# Patient Record
Sex: Female | Born: 1955 | Marital: Married | Smoking: Never smoker
Health system: Northeastern US, Community
[De-identification: ages and names within clinical notes are randomized; demographics above are authoritative.]

## PROBLEM LIST (undated history)

## (undated) VITALS — BP 131/85 | HR 78 | Temp 97.8°F | Resp 16 | Ht 62.0 in | Wt 161.0 lb

## (undated) DIAGNOSIS — H269 Unspecified cataract: Secondary | ICD-10-CM

## (undated) DIAGNOSIS — O24419 Gestational diabetes mellitus in pregnancy, unspecified control: Secondary | ICD-10-CM

## (undated) DIAGNOSIS — IMO0001 Reserved for inherently not codable concepts without codable children: Secondary | ICD-10-CM

## (undated) DIAGNOSIS — N750 Cyst of Bartholin's gland: Secondary | ICD-10-CM

## (undated) HISTORY — DX: Unspecified cataract: H26.9

## (undated) HISTORY — DX: Cyst of Bartholin's gland: N75.0

## (undated) HISTORY — DX: Reserved for inherently not codable concepts without codable children: IMO0001

## (undated) HISTORY — PX: TONSILLECTOMY & ADENOIDECTOMY <AGE 12: ENT168

---

## 2011-04-04 HISTORY — PX: VARICOSE VEIN SURGERY: SHX832

## 2013-01-06 ENCOUNTER — Ambulatory Visit (HOSPITAL_BASED_OUTPATIENT_CLINIC_OR_DEPARTMENT_OTHER): Payer: Medicaid Other | Admitting: Optometrist

## 2013-01-06 DIAGNOSIS — H5200 Hypermetropia, unspecified eye: Secondary | ICD-10-CM

## 2013-01-06 DIAGNOSIS — H269 Unspecified cataract: Secondary | ICD-10-CM

## 2013-01-06 DIAGNOSIS — H04129 Dry eye syndrome of unspecified lacrimal gland: Secondary | ICD-10-CM

## 2013-01-06 DIAGNOSIS — H524 Presbyopia: Secondary | ICD-10-CM

## 2013-01-06 NOTE — Progress Notes (Signed)
I had a pleasure of seeing 57 year old Tammy Russell at the [facility] on 01/06/2013.   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  Glasses should be worn full time.hold on Rx if surgery is considered.        Return to clinic in one months for follow-up by Ophthalmologist.    The patient has dry eye syndrome.   She is instructed to apply warm compresses to her closed eyelids twice a day, for five minutes at a time.  she can Use over-the-counter artificial tears such as Refresh, Systane, Theratears, Soothe, or Visine Tears 4 X per day or as needed.

## 2013-01-06 NOTE — Nursing Note (Signed)
>>   Rockey Situ, OD     Mon Jan 06, 2013  7:22 PM  Blurry vision for near, dry eyes sometime. Need reading glasses.

## 2013-01-15 ENCOUNTER — Ambulatory Visit (HOSPITAL_BASED_OUTPATIENT_CLINIC_OR_DEPARTMENT_OTHER): Payer: Medicaid Other | Admitting: Ophthalmology

## 2013-01-16 ENCOUNTER — Ambulatory Visit (HOSPITAL_BASED_OUTPATIENT_CLINIC_OR_DEPARTMENT_OTHER): Payer: Medicaid Other | Admitting: Ophthalmology

## 2013-01-16 DIAGNOSIS — H5200 Hypermetropia, unspecified eye: Secondary | ICD-10-CM

## 2013-01-16 DIAGNOSIS — H524 Presbyopia: Secondary | ICD-10-CM

## 2013-01-16 DIAGNOSIS — H269 Unspecified cataract: Secondary | ICD-10-CM

## 2013-01-16 NOTE — Progress Notes (Signed)
Impression:  Cataract OS>>OD:  Risks and benefits of cataract surgery discussed and patient elects to proceed with cataract surgery of the left eye in an attempt to improve the vision of that eye.  Presbyopia    Plan:  Schedule cataract surgery OS at patient's request.  Will need A-scan prior to surgery  Consent signed with telephone interpreting.

## 2013-01-16 NOTE — Nursing Note (Signed)
>>   Tammy Russell. Chilton Si, OD     Thu Jan 16, 2013 11:56 AM  Patient here for further evaluation of cataracts OS>OD following appointment with Dr. Sherrilee Gilles.    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

## 2013-02-18 ENCOUNTER — Ambulatory Visit (HOSPITAL_BASED_OUTPATIENT_CLINIC_OR_DEPARTMENT_OTHER): Payer: Medicaid Other

## 2013-02-18 ENCOUNTER — Ambulatory Visit (HOSPITAL_BASED_OUTPATIENT_CLINIC_OR_DEPARTMENT_OTHER): Payer: Medicaid Other | Admitting: Internal Medicine

## 2013-02-18 ENCOUNTER — Encounter (HOSPITAL_BASED_OUTPATIENT_CLINIC_OR_DEPARTMENT_OTHER): Payer: Self-pay | Admitting: Internal Medicine

## 2013-02-18 VITALS — BP 122/78 | HR 74 | Temp 98.4°F | Wt 164.0 lb

## 2013-02-18 DIAGNOSIS — Z23 Encounter for immunization: Secondary | ICD-10-CM

## 2013-02-18 DIAGNOSIS — Z01818 Encounter for other preprocedural examination: Secondary | ICD-10-CM

## 2013-02-18 DIAGNOSIS — Z1321 Encounter for screening for nutritional disorder: Secondary | ICD-10-CM

## 2013-02-18 DIAGNOSIS — N951 Menopausal and female climacteric states: Secondary | ICD-10-CM | POA: Insufficient documentation

## 2013-02-18 DIAGNOSIS — H269 Unspecified cataract: Secondary | ICD-10-CM

## 2013-02-18 DIAGNOSIS — Z0000 Encounter for general adult medical examination without abnormal findings: Secondary | ICD-10-CM

## 2013-02-18 DIAGNOSIS — H2513 Age-related nuclear cataract, bilateral: Secondary | ICD-10-CM

## 2013-02-18 LAB — VITAMIN D,25 HYDROXY: VITAMIN D,25 HYDROXY: 34 ng/mL (ref 30.0–100.0)

## 2013-02-18 LAB — HEMATOCRIT: HEMATOCRIT: 44.4 % (ref 34.1–44.9)

## 2013-02-18 NOTE — Progress Notes (Signed)
Chief Complaint:  Tammy Russell is a 57 year old female who presents for a pre op examination for Cataract surgery OS  Is new pt to clinic   Is from [place], moved to the [place] in 2013  Pt husband is also present at this visit         Patient Active Problem List:     Cataract     Symptomatic menopausal or female climacteric states        Current Outpatient Prescriptions:  OTHER MEDICATION Takes Natifa- Pro ( [place] HRT) Disp:  Rfl:    omeprazole (PRILOSEC) 10 MG capsule 1 capsule 2 (two) times daily. Disp:  Rfl:      No current facility-administered medications for this visit.    Allergies:  Review of Patient's Allergies indicates:  No Known Allergies    Health Maintenance:  Hiv Screening due on 06/10/1969  Tetanus (16 And Over) due on 06/10/1972  Lipid Screening due on 06/11/1974  Hep B High Risk Vaccine Eval (Once) due on 06/11/1974  Health Care Proxy due on 06/11/1974  Pap Smear due on 06/11/1986  Hpv Screening due on 06/11/1986  Physical Exam (Age 90+) due on 06/11/2006  Mammography due on 06/11/2006  Fecal Occult Blood Age 90+ due on 06/11/2006  Flu Vaccine Seasonal due on 12/02/2012    Immunizations:  Immunization History   Administered Date(s) Administered   . Influenza Virus Quadrivalent Vacc 3/> Yrs Im 02/18/2013       Histories:    Past Medical History    Cataract     Reflux     Bartholin gland cyst     Comment: I & D in [place]          Past Surgical History    CESAREAN SECTION, CLASSIC      TONSILLECTOMY & ADENOIDECTOMY       VARICOSE VEIN SURGERY  2013    Comment in [place]        Social History   Marital Status: Married  Spouse Name: N/A    Years of Education: N/A  Number of Children: N/A     Occupational History  None on file     Social History Main Topics   Smoking status: Never Smoker     Smokeless tobacco: Not on file    Alcohol Use: No    Drug Use: No    Sexual Activity: Yes    Partners: Male     Other Topics Concern   None on file     Social History Narrative    Is from  [place]    Moved to [place]  12/2011.    Lives with husband and 101 year old daugther        Family History    Osteoporosis Mother        Review of Systems:                   Skin: negative  Eyes: negative  Ears/Nose/Throat: negative  Respiratory: negative  Cardiovascular: negative  Gastrointestinal: negative  Genitourinary: negative  Gyn: no abnormal bleeding, pelvic pain or discharge  no breast pain or new or enlarging lumps on self exam. Is currently taking HRT from [place]. Reports having annual pelvic ultrasounds and mammograms in [place]  Takes HRT due to hot flashes, dry skin.   Musculoskeletal: negative  Neurologic: negative  Endocrine: negative  Psychiatric: negative  Hematologic/Lymphatic/Immunologic: negative    Physical:  BP 122/78  Pulse 74  Temp(Src) 98.4 F (36.9 C) (Oral)  Wt 164 lb (74.39 kg)  SpO2 98%  General appearance: healthy, alert, well developed, well nourished  Eyes: conjunctivae/corneas clear. PERRL, EOM's intact.+ Cataract OS  Skin: skin color, texture, turgor are normal  Head: Normocephalic. No masses, lesions, tenderness or abnormalities  Ears: External ears normal. Canals clear. TM's normal.  Nose/Sinuses: Nares normal. Septum midline. Mucosa normal. No drainage or sinus tenderness.  Oropharynx: Lips, mucosa, and tongue normal. Teeth and gums normal. Oropharynx moist and without lesion  Neck: Neck supple. No adenopathy. Thyroid symmetric, normal size, and without nodularity  Lungs: Percussion normal. Good diaphragmatic excursion. Lungs clear to auscultation bilaterally  Heart: PMI normal. No lifts, heaves, or thrills. RRR. No murmurs, clicks, gallops or rubs  Abdomen: Abdomen soft, non-tender. BS normal. No masses, no organomegaly  Extremities: Extremities normal. No deformities, edema, or skin discoloration  Musculoskeletal: Spine ROM normal. Muscular strength intact.  Peripheral pulses: radial=4/4, femoral=4/4, popliteal=4/4, dorsalis pedis=4/4  Neuro: Gait normal. Reflexes normal and symmetric. Sensation  grossly normal       ASSESSMENT/PLAN:  (366.9) Cataract  (primary encounter diagnosis) and   (V72.84) Preoperative examination, unspecified  Comment:  Normal pre op exam  EKG: NSR: pt is cleared for surgery   Plan: EKG  Plan: HEMATOCRIT AUTO           (627.2) Symptomatic menopausal or female climacteric states  Comment: Is currently taking HRT from [place] ( Natifa- Pro)  Discussed cardiovascular and cancer risks with use of HRT  Pt desires to continue to take HRT, is followed by her MD in [place] with routine mammograms and pelvic ultrasound  Reports having pelvic ultrasound and mammogram this year which was normal   Plan:     (V77.99) Encounter for vitamin deficiency screening  Comment: at this visit, pt also requests to have Vitamin D level checked since she now lives in [place]   Plan: VITAMIN D,25 HYDROXY, COLLECTION VENOUS BLOOD         VENIPUNCTURE            (V04.81) Need for prophylactic vaccination and inoculation against influenza  Comment:  Plan: IMMUNIZATION ADMIN SINGLE, RN, PR INFLUENZA         VACCINE QUADRIVALENT 3 YRS PLUS IM           I have spent 30 minutes in face to face time with this patient/patient proxy of which > 50% was in counseling or coordination of care regarding above issues/Dx.

## 2013-02-18 NOTE — Patient Instructions (Signed)
[facility]  [address]  [address]  (Formerly Known As [facility] [address])  [place]  [id number]  Clinical Nutrition Services    Calcium and Your Health    Calcium is a mineral that helps build strong bones and teeth, helps prevent brittle bones (osteoporosis) and hip fractures, and helps maintain a normal blood pressure. It is very important to get enough calcium each day.    * CALCIUM NEEDS AT ALL AGES: (RDI 2000)  Infants birth to 12 months  is 210 -270 mg/day          Child 1-8 years  is 500-.800 mg/day                     Adolescent 9-18years is 1300 mg/day                   Adults 19-30 years is 1000 mg/day  Adults 31-50 years is 1000 mg/day  Adults 51 and older is 1200 mg/day    * TO MAINTAIN GOOD BONE HEALTH, EAT A CALCIUM RICH DIET:    FOODS                                      Yogurt, plain, nonfat: 1 cup serving is 450 mgs of calcium    Ricotta cheese, part skim: 1/2 cup serving is 340 mgs of calcium    Milk, skim: 1 cup serving is 300 mgs of calcium    Milk, whole: 1 cup serving is 290 mgs of calcium    Sardines, canned, with bones: 3 oz serving is 280 mgs of calcium    Yogurt, plain, whole milk:1 cup serving is 275 mgs of calcium    Orange juice, calcium fortified:1 cup serving is 270 mgs of calcium    Swiss cheese: 1 oz serving is 270 mgs of calcium    Spinach, cooked: 1 cup serving is 240 mgs of calcium   Turnip greens, rhubarb, cooked:1 cup serving is 200 mgs of calcium   Salmon, canned, with bones: 3 oz serving is 180 mgs of calcium   Ice Cream: 1 cup serving is 175 mgs of calcium   Pudding, instant mix: 1/2 cup serving is 150 mgs of calcium   Almonds:  /2 cup serving is 150 mgs of calcium   Kale, cooked: 1 cup serving is 100 mgs of calcium   Lobster, cooked: 6 oz serving is 100 mgs of calcium   Tofu, with calcium sulfate:  3oz serving is 100 mgs of calcium     EXERCISE: Weight bearing exercises, such as walking, jogging,  racquet sports, aerobics, and weight training  help make bones stronger.     VITAMIN D: Vitamin D is essential to help your body absorb calcium. A healthy body can make its own vitamin D with the help of sunlight. But in the winter months, you may not get enough sun, and should make   sure you get it from another source, like milk or a multivitamin.     HORMONES: The hormone estrogen helps the female body and bones to use calcium. After menopause women need to discuss hormone treatment with their doctor.    * This is just a first step in helping improve your health.  * For help with a meal plan for you, make an appointment with the Nutritionist.  *   For more information, you can also contact the National Nutrition Hotline 1-800-366-1655                                                              9/95 MAS 1/96, 12/97eqj, 01/02, 4/02

## 2013-02-18 NOTE — Progress Notes (Signed)
02/18/2013  Flu Vaccine   Confirmed patient's name and date of birth.  Pt denies allergies to this vaccine.   Pt denies allergies to egg or egg products.  Pt denies allergy to contact lens solution.   Pt denies adverse effects from previous administration of this medication. Pt denies history of Guillain-Barre' syndrome.  Pt denies moderate/severe illness at this time.  Risks and benefits of Flu Vaccine reviewed with pt. VIS for Flu Vaccine  offered and reviewed with pt.    Vaccine tolerated well by patient. Patient denies adverse effects from injection at this time. Patient encouraged to utilize arm and not favor it.  Patient informed may  take pain reliever of choice and to apply ice for discomfort if necessary.  Patient will call with any questions or concerns.  Please refer to Imm./Inj. section for administration site, lot # and exp. date.    Jana Taiwana Willison, LPN

## 2013-02-20 LAB — EKG

## 2013-02-26 ENCOUNTER — Encounter (HOSPITAL_BASED_OUTPATIENT_CLINIC_OR_DEPARTMENT_OTHER): Payer: Self-pay | Admitting: Ophthalmology

## 2013-02-26 ENCOUNTER — Ambulatory Visit (HOSPITAL_BASED_OUTPATIENT_CLINIC_OR_DEPARTMENT_OTHER)
Admit: 2013-02-26 | Discharge: 2013-02-26 | Disposition: A | Discharge status: Home / Self Care | Payer: Self-pay | Source: Ambulatory Visit | Attending: Ophthalmology | Admitting: Ophthalmology

## 2013-02-26 HISTORY — DX: Gestational diabetes mellitus in pregnancy, unspecified control: O24.419

## 2013-02-26 NOTE — H&P (Signed)
PRE-ANESTHETIC HISTORY AND PHYSICAL EXAM    HPI: Tammy Russell is a 57 year old female with a history of GERD who is scheduled for a left ECCE.    ANESTHESIA HISTORY:     Past Surgical History    CESAREAN SECTION, CLASSIC      TONSILLECTOMY & ADENOIDECTOMY       VARICOSE VEIN SURGERY  2013    Comment in [place]    D&C    Patient denied complications of anesthesia without problems except for postop shivering  Patient denied family complications of anesthesia.    CURRENT MEDICATIONS:      omeprazole (PRILOSEC) 10 MG capsule 1 capsule 2 (two) times daily.     ALLERGIES:   Review of Patient's Allergies indicates:  No Known Allergies    SOCIAL HISTORY:  The patient reports that she has never smoked.   The patient reports that she does not drink alcohol.  The patient reports that she does not use illicit drugs.    PROBLEM LIST / PAST MEDICAL HISTORY:  Patient Active Problem List:     Cataract     Symptomatic menopausal or female climacteric states     GERD    REVIEW OF SYMPTOMS:  Functional capacity:  Patient can walk up two flights of stairs without problems  Airway: Denies OSA. Rare snoring.   Cardiovascular:  Pt denies angina, DOE, or  palpitations.   Pulmonary:  Currently breathing is at baseline  GI:  GERD.   On Omeprazole  Neuro: negative  Hematologic:  negative  Other pertinent review of symptoms are negative.    PHYSICAL EXAMINATION:    VITAL SIGNS: BP 125/74  Pulse 78  Temp(Src) 98.6 F (37 C) (Temporal)  Resp 20  Ht 5' 2" (1.575 m)  Wt 161 lb (73.029 kg)  BMI 29.44 kg/m2  SpO2 100%    GENERAL: NAD    AIRWAY EVALUATION / HEENT:  Mallampati: Class I     A soft palate, fauces, uvula, anterior and posterior tonsil pillars are seen..  Airway examined in the sitting position.  Oral Opening:  3 cm  Teeth:  Pt denies any loose teeth    NECK: Full range of Neck Motion:  Yes     RESPIRATORY: clear to auscultation bilaterally    CARDIOVASCULAR: normal and regular rate and rhythm    STUDIES:    EKG: 02/18/13  Normal  sinus rhythm   Normal ECG   No previous ECGs available     PERTINENT LABS:   Component Value Date/Time   HCT 44.4 02/18/2013 12:43 PM    No results found for this basename: hgba1c    ASSESSMENT AND PLAN:  ASA Assessment: I A Normal Healthy Patient  Tammy Russell is a 57 year old female with a history of GERD who is scheduled for a left ECCE. The patient  is asymptomatic with a good functional capacity who appears medically optimized for this procedure.    Emergency:  No     Potential Anesthesia Problems:  No    Plan: MAC / General Anesthesia Backup    Patient instructed to take her Omeprazole the morning of the procedure with a sip of water.     I have informed the patient and cousin, with a [place] interpreter, of the nature and purpose of the type of anesthesia, the reasonable alternative anesthesia methods, pertinent foreseeable risks involved and the possibility of complications.  I have explained that an alternative form of anesthesia may be required   by unexpected conditions arising before or during the procedure.  Questions have been answered to the satisfaction of the patient  who accepts the risk and agrees to proceed as planned.     Niko Penson, 02/26/2013, 10:19 AM    Pager: N/A        Bold Font = Mandatory Field.  For non-mandatory fields, the provider will enter relevant positive findings.

## 2013-02-26 NOTE — Discharge Instructions (Signed)
INSTRUES DO PR-OPERATRIO PARA O SURGICAL DAY CARE   SURGICAL DAY CARE PRE-OPERATIVE INSTRUCTIONS    DIA DA CIRURGIA  DAY OF SURGERY    Chegar em: [facility] em [place] (Tuesday), Dezembro (December) 2 s (Time): 9:00am.   Arrive at:  Registration on (Day of the Week, Month, Day, at (Time).        obrigatrio ter um adulto responsvel disponvel para acompanh-lo at [employer] a [place]. (Sugerimos que voc tenha algum disponvel para ajud-lo em casa aps a sua cirurgia).  You must have a responsible adult available to accompany you home after surgery. (We suggest that you have someone available to assist you at home after your surgery).    INSTRUES:   INSTRUCTIONS:   Voc no poder comer ou beber nada aps a meia-noite da noite anterior  cirurgia, nem mesmo gua, goma de mascar ou balas.   You may have nothing to eat or drink after midnight the night before your surgery, not even water, gum or hard candy.     Voc no poder fumar na manh do dia da sua cirurgia.  You may not smoke the morning of your surgery.     Por favor, deixe em casa todos os objetos de valor, incluindo joias, relgios, dinheiro, etc.  Please leave all valuables at home, including jewelry, watches, money, etc.     Remova qualquer esmalte das unhas antes de chegar ao Surgical Day Care e no use qualquer maquiagem ou batom.  Please remove all fingernail polish before arriving at Surgical Day Care and do not  wear any face or lip make-up.     No raspe os pelos da rea da cirurgia.  Do not shave surgical site.     No use lentes de contato.  Do not wear contact lenses.    MEDICAMENTOS:   MEDICATIONS:     Tome a medicao a seguir na noite anterior  cirurgia, na hora de dormir: Your usual evening medications   Take the following medication the night before surgery at bedtime:     [place] a seguir na manh do dia da sua cirurgia, com apenas um gole de gua: Omeprazole  Take the following  medication the morning of your surgery with only a sip of water:

## 2013-03-04 ENCOUNTER — Encounter (HOSPITAL_BASED_OUTPATIENT_CLINIC_OR_DEPARTMENT_OTHER): Payer: Self-pay | Admitting: Ophthalmology

## 2013-03-04 ENCOUNTER — Ambulatory Visit (HOSPITAL_BASED_OUTPATIENT_CLINIC_OR_DEPARTMENT_OTHER)
Admit: 2013-03-04 | Disposition: A | Discharge status: Home / Self Care | Payer: Self-pay | Source: Ambulatory Visit | Attending: Ophthalmology | Admitting: Ophthalmology

## 2013-03-04 DIAGNOSIS — Z961 Presence of intraocular lens: Secondary | ICD-10-CM | POA: Insufficient documentation

## 2013-03-04 HISTORY — PX: CATARACT REMOVAL INSERTION OF LENS: OPH121

## 2013-03-04 MED ORDER — LIDOCAINE-EPINEPHRINE 2 %-1:200000 IJ SOLN
INTRAMUSCULAR | Status: AC
Start: 2013-03-04 — End: 2013-03-04
  Administered 2013-03-04: 3 mL via INTRAOCULAR
  Filled 2013-03-04: qty 20

## 2013-03-04 MED ORDER — ERYTHROMYCIN 5 MG/GM OP OINT
0.5000 [in_us] | TOPICAL_OINTMENT | Freq: Once | OPHTHALMIC | Status: AC
Start: 2013-03-04 — End: 2013-03-04
  Administered 2013-03-04: 0.5 [in_us] via OPHTHALMIC

## 2013-03-04 MED ORDER — MIDAZOLAM HCL 2 MG/2ML IJ SOLN
INTRAMUSCULAR | Status: AC
Start: 2013-03-04 — End: 2013-03-04
  Filled 2013-03-04: qty 0.5

## 2013-03-04 MED ORDER — GENTAMICIN SULFATE 40 MG/ML IJ SOLN
INTRAMUSCULAR | Status: AC
Start: 2013-03-04 — End: 2013-03-04
  Administered 2013-03-04: 20 mg via INTRAOCULAR
  Filled 2013-03-04: qty 2

## 2013-03-04 MED ORDER — PHENYLEPHRINE HCL 2.5 % OP SOLN
OPHTHALMIC | Status: DC
Start: 2013-03-04 — End: 2013-03-04
  Filled 2013-03-04: qty 5

## 2013-03-04 MED ORDER — BSS IO SOLN
INTRAOCULAR | Status: AC
Start: 2013-03-04 — End: 2013-03-04
  Administered 2013-03-04: 30 mL
  Filled 2013-03-04: qty 30

## 2013-03-04 MED ORDER — LACTATED RINGERS IV SOLN
INTRAVENOUS | Status: DC
Start: 2013-03-04 — End: 2013-03-04
  Administered 2013-03-04: 10:00:00 via INTRAVENOUS

## 2013-03-04 MED ORDER — ACETAMINOPHEN 325 MG PO TABS
650.0000 mg | ORAL_TABLET | Freq: Four times a day (QID) | ORAL | Status: DC | PRN
Start: 2013-03-04 — End: 2013-03-04

## 2013-03-04 MED ORDER — CYCLOPENTOLATE HCL 1 % OP SOLN
1.0000 [drp] | OPHTHALMIC | Status: AC
Start: 2013-03-04 — End: 2013-03-04
  Administered 2013-03-04 (×3): 1 [drp] via OPHTHALMIC

## 2013-03-04 MED ORDER — TROPICAMIDE 1 % OP SOLN
1.0000 [drp] | OPHTHALMIC | Status: AC
Start: 2013-03-04 — End: 2013-03-04
  Administered 2013-03-04 (×3): 1 [drp] via OPHTHALMIC

## 2013-03-04 MED ORDER — TETRACAINE HCL 0.5 % OP SOLN
OPHTHALMIC | Status: DC
Start: 2013-03-04 — End: 2013-03-04
  Filled 2013-03-04: qty 2

## 2013-03-04 MED ORDER — BUPIVACAINE HCL (PF) 0.75 % IJ SOLN
INTRAMUSCULAR | Status: AC
Start: 2013-03-04 — End: 2013-03-04
  Administered 2013-03-04: 3 mL via INTRAOCULAR
  Filled 2013-03-04: qty 10

## 2013-03-04 MED ORDER — ACETAZOLAMIDE ER 500 MG PO CP12
500.0000 mg | ORAL_CAPSULE | Freq: Once | ORAL | Status: AC
Start: 2013-03-04 — End: 2013-03-04
  Administered 2013-03-04: 500 mg via ORAL

## 2013-03-04 MED ORDER — PROPOFOL 10 MG/ML IV EMUL
INTRAVENOUS | Status: AC
Start: 2013-03-04 — End: 2013-03-04
  Filled 2013-03-04: qty 20

## 2013-03-04 MED ORDER — ACETYLCHOLINE CHLORIDE 1:100 IO SOLR
INTRAOCULAR | Status: AC
Start: 2013-03-04 — End: 2013-03-04
  Administered 2013-03-04: 2 mL via INTRAOCULAR
  Filled 2013-03-04: qty 2

## 2013-03-04 MED ORDER — FLURBIPROFEN SODIUM 0.03 % OP SOLN
1.0000 [drp] | OPHTHALMIC | Status: AC
Start: 2013-03-04 — End: 2013-03-04
  Administered 2013-03-04 (×3): 1 [drp] via OPHTHALMIC

## 2013-03-04 MED ORDER — MOXIFLOXACIN HCL 0.5 % OP SOLN
1.0000 [drp] | OPHTHALMIC | Status: AC
Start: 2013-03-04 — End: 2013-03-04
  Administered 2013-03-04 (×3): 1 [drp] via OPHTHALMIC

## 2013-03-04 MED ORDER — BSS IO SOLN
Freq: Once | INTRAOCULAR | Status: AC
Start: 2013-03-04 — End: 2013-03-04
  Administered 2013-03-04: 11:00:00 via OPHTHALMIC
  Filled 2013-03-04: qty 0.5

## 2013-03-04 MED ORDER — PHENYLEPHRINE HCL 2.5 % OP SOLN
1.0000 [drp] | OPHTHALMIC | Status: AC
Start: 2013-03-04 — End: 2013-03-04
  Administered 2013-03-04 (×3): 1 [drp] via OPHTHALMIC

## 2013-03-04 NOTE — H&P (Signed)
See H&P by Carolyn [place], APRN on 02/18/13  Patient Assessment Update: (Fill out Prior to procedure or within 24 hours of  admission if H&P done pre-admission.)   Re-evaluation including history review and physical examination has been performed.    Patient's Condition No Change    Tammy Russell, 03/04/2013, 10:27 AM

## 2013-03-04 NOTE — Brief Op Note (Signed)
Brief Procedure or Operative Note    Procedure: Phacoemulsification cataract extraction with PC IOL OS    Pre-Procedure Diagnosis:   Cataract OS    Post-Procedure Diagnosis:  Same    Surgeon: Lanell Persons, MD    Assistant:  None    Type of Anesthesia:   MAC = Monitored Anesthesia Care (Sedation)    Findings:   no abnormalities    Estimated Blood Loss:   negligible    Specimens Removed:  None    Complications:  None    Other (e.g. Implants):  See Ileene Patrick Robbie Rideaux, 03/04/2013, 11:15 AM      Pager : [phone]                  .

## 2013-03-04 NOTE — Op Note (Signed)
DATE OF PROCEDURE:  03/04/13    PROCEDURE:  Phacoemulsification cataract extraction with posterior chamber intraocular lens implantation, left eye    PRE-OP DIAGNOSIS:  Cataract, left eye    POST-OP DIAGNOSIS:  The same    SURGEON:  Marylou Mccoy, MD    ASSISTANT:  None    ANESTHESIA:  Local infiltration with IV sedation.    The patient was prepped and draped in the usual manner for ocular surgery of the left eye.  Anesthesia and akinesia was achieved using a mixture of 2% Xylocaine with epinephrine and 0.5% Marcaine in a 50/50 mixture.  This was administered in a peribulbar block.  Steri-Strips were placed over the lashes of the left eye and a lid speculum was placed in the left eye.  A limbal peritomy was performed superiorly.  Bleeding was controlled with wet-field cautery.  A groove was made at the surgical limbus using a 57 Beaver blade.  The wound was extended anteriorly into clear cornea using a spoon blade.  The anterior chamber was entered at 2 o'clock using a Super blade.  Viscoelastic was injected into the anterior chamber.  The anterior chamber was then entered through the tunneled incision using a 3.0-mm keratome.  An anterior capsulotomy was performed using a continuous curvilinear approach.  The anterior capsular flap was removed from the eye.  BSS was used to hydrodissect and hydrodelineate the lens nucleus.  The lens nucleus was then emulsified and aspirated using the phacoemulsification unit in a divide-and-conquer technique.  The remainder of the cortical material was aspirated using the irrigating aspirating apparatus.  Viscoelastic was then again injected to inflate the posterior capsular bag.  Under viscoelastic a posterior chamber intraocular lens, Bausch and Lomb model LI61AO of +18.5 diopters was injected into the posterior capsular bag.  The remainder of the viscoelastic was aspirated from the eye.  Miochol was injected into the eye.  The wound was then closed using a single 10-0 nylon  interrupted suture.  The conjunctiva was brought down over the wound.  A 0.5 mL of gentamicin and 0.5 mL of Decadron were injected subseptally.  A drop of Vigamox and a drop of erythromycin eye ointment were placed in the left eye and a patch and Fox shield were applied.  The patient left the operating room in excellent condition for the recovery room.

## 2013-03-04 NOTE — H&P (View-Only) (Signed)
PRE-ANESTHETIC HISTORY AND PHYSICAL EXAM    HPI: Tammy Russell is a 57 year old female with a history of GERD who is scheduled for a left ECCE.    ANESTHESIA HISTORY:     Past Surgical History    CESAREAN SECTION, CLASSIC      TONSILLECTOMY & ADENOIDECTOMY       VARICOSE VEIN SURGERY  2013    Comment in [place]    D&C    Patient denied complications of anesthesia without problems except for postop shivering  Patient denied family complications of anesthesia.    CURRENT MEDICATIONS:      omeprazole (PRILOSEC) 10 MG capsule 1 capsule 2 (two) times daily.     ALLERGIES:   Review of Patient's Allergies indicates:  No Known Allergies    SOCIAL HISTORY:  The patient reports that she has never smoked.   The patient reports that she does not drink alcohol.  The patient reports that she does not use illicit drugs.    PROBLEM LIST / PAST MEDICAL HISTORY:  Patient Active Problem List:     Cataract     Symptomatic menopausal or female climacteric states     GERD    REVIEW OF SYMPTOMS:  Functional capacity:  Patient can walk up two flights of stairs without problems  Airway: Denies OSA. Rare snoring.   Cardiovascular:  Pt denies angina, DOE, or  palpitations.   Pulmonary:  Currently breathing is at baseline  GI:  GERD.   On Omeprazole  Neuro: negative  Hematologic:  negative  Other pertinent review of symptoms are negative.    PHYSICAL EXAMINATION:    VITAL SIGNS: BP 125/74  Pulse 78  Temp(Src) 98.6 F (37 C) (Temporal)  Resp 20  Ht 5\' 2"  (1.575 m)  Wt 161 lb (73.029 kg)  BMI 29.44 kg/m2  SpO2 100%    GENERAL: NAD    AIRWAY EVALUATION / HEENT:  Mallampati: Class I     A soft palate, fauces, uvula, anterior and posterior tonsil pillars are seen..  Airway examined in the sitting position.  Oral Opening:  3 cm  Teeth:  Pt denies any loose teeth    NECK: Full range of Neck Motion:  Yes     RESPIRATORY: clear to auscultation bilaterally    CARDIOVASCULAR: normal and regular rate and rhythm    STUDIES:    EKG: 02/18/13  Normal  sinus rhythm   Normal ECG   No previous ECGs available     PERTINENT LABS:   Component Value Date/Time   HCT 44.4 02/18/2013 12:43 PM    No results found for this basename: hgba1c    ASSESSMENT AND PLAN:  ASA Assessment: I A Normal Healthy Patient  Tammy Russell is a 57 year old female with a history of GERD who is scheduled for a left ECCE. The patient  is asymptomatic with a good functional capacity who appears medically optimized for this procedure.    Emergency:  No     Potential Anesthesia Problems:  No    Plan: MAC / General Anesthesia Backup    Patient instructed to take her Omeprazole the morning of the procedure with a sip of water.     I have informed the patient and cousin, with a [place] interpreter, of the nature and purpose of the type of anesthesia, the reasonable alternative anesthesia methods, pertinent foreseeable risks involved and the possibility of complications.  I have explained that an alternative form of anesthesia may be required  by unexpected conditions arising before or during the procedure.  Questions have been answered to the satisfaction of the patient  who accepts the risk and agrees to proceed as planned.     Tammy Russell, 02/26/2013, 10:19 AM    Pager: N/A        Monica Martinez = Mandatory Field.  For non-mandatory fields, the provider will enter relevant positive findings.

## 2013-03-04 NOTE — Discharge Instructions (Signed)
DO NOT operate an automobile or any other heavy machinery for at least 12 hours.  Avoid making important and binding decisions for 24 hours after the surgery.  Dietery Instructions:  You may eat normally, but consider starting with liquids and easily digestible foods, and then progressing to your regular diet as tolerated by your stomach.    Medications:  1.  Unless directed differently by Dr. Chesley Mires, DO NOT start your eye drops until after your first post-operative appointment.  You will be given a bag with drops.  2.  For mild eye discomfort, you may take Tylenol or Non-aspirin pain reliever as needed, every 4 hours for pain.  IF YOU HAVE MORE SEVERE PAIN, PLEASE CALL IMMEDIATELY TO [phone].  Dr. Chesley Mires or Dr. Nathaneil Canary will call you back for instructions.  3.  Continue to take all of your other regular medications that you are taking by mouth.  Also continue taking any eye drops which were previously prescribed for the other non-operative eye.    First post-operative appointment will be the next day at [facility] [address] in [place], [place] [address] at 11AM.  Your eye dressing will be removed and your eye will be examined.  Your post-operative care, including medications, will be reviewed at that time.    Please be sure to keep your follow-up appointment with Dr. Chesley Mires tomorrow.  Cuidados aps uma cirurgia de catarata   (Cataract Surgery, Care After)  Consulte esta planilha nas prximas semanas. Essas instrues fornecem informaes gerais em seus cuidados aps o procedimento. Seu mdico tambm pode lhe fornecer instrues detalhadas. Seu tratamento foi planejado de acordo com as prticas mdicas atuais, mas s vezes ocorrem problemas. Consulte seu mdico se tiver quaisquer problemas ou perguntas aps seu procedimento.   INSTRUES PARA TRATAMENTO DOMICILIAR    Evite atividades extenuantes, conforme receitado pelo seu mdico.   Pergunte ao seu mdico quando poder voltar a dirigir.   Use  colrios ou outros medicamentos para ajudar a Administrator a presso em seu olho conforme orientado por seu mdico.   Somente use remdios de venda livre ou com receita para dor, desconforto ou febre conforme instrudo pelo seu mdico.   No toque ou esfregue seus olhos.   Voc pode ser orientado a usar um tampo de proteo durante os primeiros dias e noites aps a cirurgia. Se no, use culos de sol para proteger seus olhos. Eles servem para proteger seus olhos da presso ou de ser [employer].   Mantenha a rea em torno [employer] limpa e seca. Evite nadar ou deixar que a gua atinja diretamente seus rosto ao tomar banho. Mantenha o sabonete e shampoo [employer].   No incline ou levante objetos pesados. A inclinao aumenta a presso nos olhos. Voc pode caminhar, subir escadas e realizar afazeres domsticos leves.   No coloque lentes de contato no olho que passou por cirurgia at [employer] autorize.   Pergunte ao oftalmologista quando poder retornar ao trabalho. Isto depender do tipo de trabalho que voc faz. Se trabalhar em um ambiente empoeirado, voc pode ser orientado a usar culos de proteo por um perodo de tempo.   Pergunte ao seu oftalmologista quando ser seguro ter relaes sexuais.   Continue com seus exames de vista regulares conforme orientado por seu oftalmologista.  O que esperar:     normal sentir coceira e um leve desconforto por alguns dias aps a cirurgia de catarata. Alguma descarga de fludos tambm  comum e  seu olho pode ficar sensvel  luz e ao toque.   Aps 1 ou 2 dias, mesmo um desconforto moderado deve desaparecer. Na [facility], a cicatrizao ocorrer em cerca de 6 semanas.   Se recebeu lentes intraoculares (IOL), voc pode notar que as cores esto muito brilhantes ou ter um tom azul. Tambm, se estiver na luz do sol, tudo pode parecer avermelhado por algumas horas. Se enxergar esses tons de cores  porque seu  cristalino est mais claro e no mais turvo. Dentro de alguns meses aps receber uma IOL, essas cores extras devem desaparecer. Quando tiver cicatrizado, voc provavelmente precisar de novos culos.  PROCURE UM MDICO SE:    Tiver um aumento do machucado em torno do olho.   Tiver desconforto e o medicamento no ajudar.  PROCURE UM MDICO IMEDIATAMENTE SE:    Tiver febre.   Tiver uma piora ou uma sbita perda da viso.   Houver vermelhido, inchao ou crescente dor no olho.   Tiver uma descarga grosa do olho que passou pela cirurgia.  CERTIFIQUE-SE DE:    Compreender estas instrues.   Observar as suas condies.   Procurar um mdico imediatamente se no se sentir bem ou piorar.  Document Released: 01/15/2009 Document Revised: 06/12/2011  [facility] Patient Information 2014 [place], [place].

## 2013-03-04 NOTE — Interval H&P Note (Signed)
ANESTHESIA INTERVAL NOTE   History and chart reviewed, patient examined prior to administration of anesthesia. Feels well. No change in clinical condition. NPO. Appears stable for CAT today. VSS.  There were no vitals taken for this visit.   Plan -MAC  Nadara Mustard, MD, 03/04/2013, 9:51 AM  Pager [phone]

## 2013-03-05 ENCOUNTER — Ambulatory Visit (HOSPITAL_BASED_OUTPATIENT_CLINIC_OR_DEPARTMENT_OTHER): Payer: Medicaid Other | Admitting: Ophthalmology

## 2013-03-05 DIAGNOSIS — H269 Unspecified cataract: Secondary | ICD-10-CM

## 2013-03-05 DIAGNOSIS — Z961 Presence of intraocular lens: Secondary | ICD-10-CM

## 2013-03-05 MED FILL — Prednisolone Acetate Ophth Susp 1%: OPHTHALMIC | Qty: 5 | Status: AC

## 2013-03-05 MED FILL — Moxifloxacin HCl Ophth Soln 0.5% (Base Equiv): OPHTHALMIC | Qty: 3 | Status: AC

## 2013-03-05 MED FILL — Flurbiprofen Sodium Ophth Soln 0.03%: OPHTHALMIC | Qty: 2.5 | Status: AC

## 2013-03-05 MED FILL — Cyclopentolate HCl Ophth Soln 1%: OPHTHALMIC | Qty: 2 | Status: AC

## 2013-03-05 MED FILL — Erythromycin Ophth Oint 5 MG/GM: OPHTHALMIC | Qty: 3.5 | Status: AC

## 2013-03-05 MED FILL — Acetazolamide Cap ER 12HR 500 MG: ORAL | Qty: 1 | Status: AC

## 2013-03-05 NOTE — Nursing Note (Signed)
>>   Tammy Russell,OT     Wed Mar 05, 2013 10:34 AM  1 day S/P phaco w/ PC IOL - OS 03/04/2013 by Dr. Chesley Mires    No pain, No discomfort

## 2013-03-06 MED ORDER — PREDNISOLONE ACETATE 1 % OP SUSP
1.0000 [drp] | Freq: Four times a day (QID) | OPHTHALMIC | Status: DC
Start: 2013-03-06 — End: 2013-03-12

## 2013-03-06 MED ORDER — MOXIFLOXACIN HCL 0.5 % OP SOLN
1.00 [drp] | Freq: Four times a day (QID) | OPHTHALMIC | Status: AC
Start: 2013-03-06 — End: 2013-03-13

## 2013-03-06 NOTE — Progress Notes (Signed)
Impression:  Pseudophakia OS:  Doing well 1 day s/p cataract extraction.  Cataract OD    Plan:  Begin Pred forte and Vigamox OS qid  Shield at bedtime X 1 week  Call with increased pain or decreased vision  See 1 week.

## 2013-03-12 ENCOUNTER — Ambulatory Visit (HOSPITAL_BASED_OUTPATIENT_CLINIC_OR_DEPARTMENT_OTHER): Payer: Medicaid Other | Admitting: Ophthalmology

## 2013-03-12 DIAGNOSIS — H26492 Other secondary cataract, left eye: Secondary | ICD-10-CM

## 2013-03-12 DIAGNOSIS — Z961 Presence of intraocular lens: Secondary | ICD-10-CM

## 2013-03-12 MED ORDER — PREDNISOLONE ACETATE 1 % OP SUSP
1.0000 [drp] | Freq: Four times a day (QID) | OPHTHALMIC | Status: DC
Start: 2013-03-12 — End: 2013-03-12

## 2013-03-12 MED ORDER — PREDNISOLONE ACETATE 1 % OP SUSP
1.0000 [drp] | Freq: Two times a day (BID) | OPHTHALMIC | Status: DC
Start: 2013-03-12 — End: 2013-04-07

## 2013-03-12 NOTE — Progress Notes (Signed)
Impression:  Pseudophakia OS:  Doing well 1 week s/p cataract extraction.  Mild posterior capsular opacification OS  Cataract OD    Plan:  Discontinue Vigamox  Continue Pred forte OS tid X 1 week, then bid  See 4 weeks.  Refract  Consider YAG capsulotomy

## 2013-03-12 NOTE — Nursing Note (Signed)
>>   Dina Luis-Travassos,OT     Wed Mar 12, 2013 10:00 AM  1 week S/P phaco w/ PC IOL - OS 03/04/2013 by Dr. Chesley Mires    VA is much improved in OS after surgery, but still blurry SC.    H/O: Pseudophakia          Cataract         >> Dina Luis-Travassos,OT     Wed Mar 12, 2013  9:55 AM    VA is os much improved

## 2013-04-07 ENCOUNTER — Ambulatory Visit (HOSPITAL_BASED_OUTPATIENT_CLINIC_OR_DEPARTMENT_OTHER): Payer: Medicaid Other | Admitting: Ophthalmology

## 2013-04-07 DIAGNOSIS — H26492 Other secondary cataract, left eye: Secondary | ICD-10-CM

## 2013-04-07 DIAGNOSIS — Z961 Presence of intraocular lens: Secondary | ICD-10-CM

## 2013-04-07 MED ORDER — PREDNISOLONE ACETATE 1 % OP SUSP
1.0000 [drp] | Freq: Two times a day (BID) | OPHTHALMIC | Status: DC
Start: 2013-04-07 — End: 2013-04-28

## 2013-04-07 NOTE — Nursing Note (Signed)
>>   Algernon HuxleyEmily R. Bellows,OT     Mon Apr 07, 2013 10:57 AM  [id number] YF here after s/p pciol OS on 03/05/13  Pt states that her VA has been better since surgery still a bit hard for distance.   Denies pain or pressure.

## 2013-04-07 NOTE — Progress Notes (Signed)
Impression:  Pseudophakia OS:  Doing well 1 month  s/p cataract extraction OS.  Mild posterior capsular opacification OS  Cataract OD    Plan:  Discontinue Pred forte OS   Return for YAG capsulotomy

## 2013-04-28 ENCOUNTER — Ambulatory Visit (HOSPITAL_BASED_OUTPATIENT_CLINIC_OR_DEPARTMENT_OTHER): Payer: MEDICAID | Admitting: Ophthalmology

## 2013-04-28 ENCOUNTER — Encounter (HOSPITAL_BASED_OUTPATIENT_CLINIC_OR_DEPARTMENT_OTHER): Payer: Self-pay | Admitting: Ophthalmology

## 2013-04-28 DIAGNOSIS — H26492 Other secondary cataract, left eye: Secondary | ICD-10-CM

## 2013-04-28 DIAGNOSIS — Z961 Presence of intraocular lens: Secondary | ICD-10-CM

## 2013-04-28 HISTORY — PX: POST-CATARACT LASER SURGERY: OPH84

## 2013-04-28 MED ORDER — PREDNISOLONE ACETATE 1 % OP SUSP
1.0000 [drp] | Freq: Two times a day (BID) | OPHTHALMIC | Status: DC
Start: 2013-04-28 — End: 2013-05-07

## 2013-04-28 NOTE — Nursing Note (Signed)
>>   Tammy Russell,OT     Mon Apr 28, 2013  2:04 PM  The patient is having a Neodymium Yag laser capsulotomy of the left eye.  The risks, benefits and alternatives of Yag laser capsulotomy have been discussed, and the patient has had his/her questions answered satisfactorily.    Preoperative eye drops were installed by (initials) DLT at (time) 1:55 PM.  These are dilating drops (Phe 2.5%; mydriacyl 1%) and alphagan P 0.15%) to the left eye.    If the technician recorded the intraocular pressure in left eye, 20 minutes after the procedure the record IOP (mm HG):

## 2013-04-28 NOTE — Progress Notes (Signed)
The patient is having a Neodymium Yag laser capsulotomy of the left eye.  The risks, benefits and alternatives of Yag laser capsulotomy have been discussed, and the patient has had his/her questions answered satisfactorily.    PATIENT/PROCEDURE VERIFICATION DOCUMENTATION    Correct patient: Yes  Correct procedure: Yes  Correct side, site, mark visible if applicable: Yes  Correct position: Yes  Special equipment/implant(s) present, if applicable: Yes    Time-out completed, documented by provider doing procedure or designated team member:  Marylou MccoyMadeline Dawsen Krieger    04/28/2013    7:52 PM    Neodymium Yag Laser Capsulotomy Record    Power 1.7 millijoules    Number of shots 14    Drawing created? Yes    The patient tolerated well? Yes.    Impression:  Pseudophakia OS  Posterior capsular opacification OS  Cataract OD    Plan:  Resume Pred forte OS qid X 1 week, then discontinue  See 1 week.  Refract/dilate

## 2013-05-07 ENCOUNTER — Ambulatory Visit (HOSPITAL_BASED_OUTPATIENT_CLINIC_OR_DEPARTMENT_OTHER): Payer: Medicaid Other | Admitting: Ophthalmology

## 2013-05-07 DIAGNOSIS — H524 Presbyopia: Secondary | ICD-10-CM

## 2013-05-07 DIAGNOSIS — H269 Unspecified cataract: Secondary | ICD-10-CM

## 2013-05-07 DIAGNOSIS — Z961 Presence of intraocular lens: Secondary | ICD-10-CM

## 2013-05-07 NOTE — Nursing Note (Signed)
>>   Natasha BenceYelena Shifrin,OT     Wed May 07, 2013 10:52 AM  F/u s/p YAG Capsulotomy OS (04/28/13) due to PCO OS, Pseudophakia OS (03/04/13), Cataract OD.  H/o Gestational diabetes.  Vision OS is good.

## 2013-05-10 NOTE — Progress Notes (Signed)
Impression:  Pseudophakia OS:  Doing well 1 week s/p YAG capsulotomy OS;  2 month  s/p cataract extraction OS.   Early cataract OD    Plan:  Discontinue Pred forte OS   Rx given for spectacle lenses  Reassured of ocular health  See 6 months.

## 2013-08-19 ENCOUNTER — Encounter (HOSPITAL_BASED_OUTPATIENT_CLINIC_OR_DEPARTMENT_OTHER): Payer: Self-pay | Admitting: Ophthalmology

## 2013-09-17 ENCOUNTER — Telehealth (HOSPITAL_BASED_OUTPATIENT_CLINIC_OR_DEPARTMENT_OTHER): Payer: Self-pay | Admitting: Internal Medicine

## 2013-09-17 NOTE — Progress Notes (Signed)
Tel phone call to patient, [facility].

## 2013-11-03 ENCOUNTER — Encounter (HOSPITAL_BASED_OUTPATIENT_CLINIC_OR_DEPARTMENT_OTHER): Payer: Self-pay | Admitting: Ophthalmology

## 2013-11-03 ENCOUNTER — Ambulatory Visit (HOSPITAL_BASED_OUTPATIENT_CLINIC_OR_DEPARTMENT_OTHER): Payer: No Typology Code available for payment source | Admitting: Ophthalmology

## 2013-11-03 DIAGNOSIS — H1013 Acute atopic conjunctivitis, bilateral: Secondary | ICD-10-CM

## 2013-11-03 DIAGNOSIS — H5201 Hypermetropia, right eye: Secondary | ICD-10-CM

## 2013-11-03 DIAGNOSIS — H524 Presbyopia: Secondary | ICD-10-CM

## 2013-11-03 DIAGNOSIS — H5200 Hypermetropia, unspecified eye: Secondary | ICD-10-CM

## 2013-11-03 DIAGNOSIS — H1010 Acute atopic conjunctivitis, unspecified eye: Secondary | ICD-10-CM | POA: Insufficient documentation

## 2013-11-03 DIAGNOSIS — H269 Unspecified cataract: Secondary | ICD-10-CM

## 2013-11-03 DIAGNOSIS — Z961 Presence of intraocular lens: Secondary | ICD-10-CM

## 2013-11-03 MED ORDER — CROMOLYN SODIUM 4 % OP SOLN
1.0000 [drp] | Freq: Four times a day (QID) | OPHTHALMIC | Status: DC
Start: 2013-11-03 — End: 2014-04-22

## 2013-11-03 NOTE — Progress Notes (Signed)
Impression:  Allergic conjunctivitis by history  Pseudophakia OS/Early cataract OD    Plan:  Rx given for spectacle lenses  Reassured of ocular health  Crolom OU qid prn irritation  See 1 year.

## 2013-11-03 NOTE — Nursing Note (Signed)
58 year old female s/p phaco/pciol, left eye  Returns for 47month f/u.    Pt c/o painless blur os>od  At distance and near.    She also is bothered by allergy symptoms.  C/o itchy/watery eyes- she requests a script for an allergy med.

## 2014-04-22 ENCOUNTER — Other Ambulatory Visit (HOSPITAL_BASED_OUTPATIENT_CLINIC_OR_DEPARTMENT_OTHER): Payer: Self-pay | Admitting: Ophthalmology

## 2014-05-14 ENCOUNTER — Telehealth (HOSPITAL_BASED_OUTPATIENT_CLINIC_OR_DEPARTMENT_OTHER): Payer: Self-pay

## 2014-05-14 NOTE — Progress Notes (Signed)
Outreached for Mammo/Pap/PE. Pt stated [facility].

## 2014-06-07 ENCOUNTER — Other Ambulatory Visit (HOSPITAL_BASED_OUTPATIENT_CLINIC_OR_DEPARTMENT_OTHER): Payer: Self-pay | Admitting: Ophthalmology

## 2017-06-06 ENCOUNTER — Ambulatory Visit (HOSPITAL_BASED_OUTPATIENT_CLINIC_OR_DEPARTMENT_OTHER): Payer: 59 | Admitting: Ophthalmology

## 2017-08-29 ENCOUNTER — Ambulatory Visit (HOSPITAL_BASED_OUTPATIENT_CLINIC_OR_DEPARTMENT_OTHER): Payer: 59 | Admitting: Ophthalmology

## 2017-08-29 DIAGNOSIS — H25811 Combined forms of age-related cataract, right eye: Secondary | ICD-10-CM

## 2017-08-29 DIAGNOSIS — H524 Presbyopia: Secondary | ICD-10-CM

## 2017-08-29 DIAGNOSIS — Z961 Presence of intraocular lens: Secondary | ICD-10-CM

## 2017-08-29 NOTE — Progress Notes (Signed)
62 year old female Here for  Cataract Evaluation OD   Patient States:  (+) Vision Changes " Blurred Vision at distance and near OD , getting worse since last visit   (-) Flashes   (-) Floaters  (-) Eye Pain   (-) Redness and Irritation   (-) Discomfort    Ocular History:  Pseudophakia OS   Cataract OD     Ocular Medications:   None

## 2017-08-29 NOTE — Progress Notes (Signed)
Impression:  Cataract OD:  Risks and benefits of cataract surgery discussed and patient elects to proceed with cataract surgery of the right eye in an attempt to improve the vision of that eye.  Pseudophakia OS    Plan:  Schedule cataract surgery OD per patient's request.  Target refraction plano  Consent signed.  A-scan already performed.

## 2017-08-30 ENCOUNTER — Ambulatory Visit (HOSPITAL_BASED_OUTPATIENT_CLINIC_OR_DEPARTMENT_OTHER): Payer: Self-pay | Admitting: Ophthalmology

## 2017-10-25 ENCOUNTER — Encounter (HOSPITAL_BASED_OUTPATIENT_CLINIC_OR_DEPARTMENT_OTHER): Payer: Self-pay

## 2017-10-25 NOTE — Discharge Instructions (Signed)
SURGICAL DAY CARE PRE-OPERATIVE INSTRUCTIONS    DAY OF SURGERY    [facility] on Tuesday, July  30 at 8am    You must have a responsible adult available to accompany you home after surgery. (We suggest that you have someone available to assist you at home after your surgery).    INSTRUCTIONS:      You may have nothing to eat or drink after midnight the night before your surgery, not even water, gum or hard candy.      You may not smoke the morning of your surgery.     Please leave all valuables at home, including jewelry, watches, money, etc.     Please remove all fingernail polish before arriving at Surgical Day Care and do not  wear any face or lip make-up.     Do not shave surgical site.     Do not wear contact lenses.    MEDICATIONS:     Take the following medication the night before surgery at bedtime: USUAL PM    Take the following medication the morning of your surgery with only a sip of water: OMEPRAZOLE

## 2017-10-25 NOTE — Addendum Note (Signed)
Addended by: Marylou MccoyBAROTT, Arijana Narayan on: 10/25/2017 05:57 PM     Modules accepted: Orders, SmartSet

## 2017-10-26 ENCOUNTER — Inpatient Hospital Stay (HOSPITAL_BASED_OUTPATIENT_CLINIC_OR_DEPARTMENT_OTHER): Admit: 2017-10-26 | Discharge: 2017-10-26 | Disposition: A | Discharge status: Home / Self Care | Payer: Self-pay

## 2017-10-30 ENCOUNTER — Ambulatory Visit
Admission: RE | Admit: 2017-10-30 | Discharge: 2017-10-30 | Disposition: A | Discharge status: Home / Self Care | Payer: PPO | Attending: Ophthalmology | Admitting: Ophthalmology

## 2017-10-30 ENCOUNTER — Encounter (HOSPITAL_BASED_OUTPATIENT_CLINIC_OR_DEPARTMENT_OTHER): Payer: Self-pay | Admitting: Ophthalmology

## 2017-10-30 ENCOUNTER — Encounter (HOSPITAL_BASED_OUTPATIENT_CLINIC_OR_DEPARTMENT_OTHER)
Admission: RE | Disposition: A | Discharge status: Home / Self Care | Payer: Self-pay | Source: Ambulatory Visit | Attending: Ophthalmology

## 2017-10-30 ENCOUNTER — Ambulatory Visit (HOSPITAL_BASED_OUTPATIENT_CLINIC_OR_DEPARTMENT_OTHER): Payer: PPO | Admitting: Anesthesiology

## 2017-10-30 DIAGNOSIS — H269 Unspecified cataract: Secondary | ICD-10-CM | POA: Diagnosis present

## 2017-10-30 DIAGNOSIS — Z961 Presence of intraocular lens: Secondary | ICD-10-CM

## 2017-10-30 HISTORY — PX: CATARACT REMOVAL INSERTION OF LENS: OPH121

## 2017-10-30 LAB — BLOOD SUGAR FINGERSTICK (POINT OF CARE): FINGERSTICK GLUCOSE: 85 mg/dl (ref 74–160)

## 2017-10-30 SURGERY — EXTRACTION, CATARACT, WITH IOL INSERTION
Anesthesia: Monitor Anesthesia Care | Site: Eye | Laterality: Right | Wound class: Class I/ Clean

## 2017-10-30 MED ORDER — BUPIVACAINE HCL (PF) 0.75 % IJ SOLN
INTRAMUSCULAR | Status: AC
Start: 2017-10-30 — End: 2017-10-30
  Administered 2017-10-30: 4 mL
  Filled 2017-10-30: qty 10

## 2017-10-30 MED ORDER — CYCLOPENTOLATE HCL 1 % OP SOLN
1.0000 [drp] | OPHTHALMIC | Status: AC
Start: 2017-10-30 — End: 2017-10-30
  Administered 2017-10-30 (×3): 1 [drp] via OPHTHALMIC

## 2017-10-30 MED ORDER — REMIFENTANIL HCL 1 MG IV SOLR
Freq: Once | INTRAVENOUS | Status: DC | PRN
Start: 2017-10-30 — End: 2017-10-30
  Administered 2017-10-30: 70 ug via INTRAVENOUS

## 2017-10-30 MED ORDER — LIDOCAINE-EPINEPHRINE 2 %-1:200000 IJ SOLN
INTRAMUSCULAR | Status: AC
Start: 2017-10-30 — End: 2017-10-30
  Administered 2017-10-30: 4 mL via SUBCUTANEOUS
  Filled 2017-10-30: qty 20

## 2017-10-30 MED ORDER — DEXAMETHASONE SODIUM PHOSPHATE 4 MG/ML IJ SOLN
INTRAMUSCULAR | Status: AC
Start: 2017-10-30 — End: 2017-10-30
  Administered 2017-10-30: 2 mg
  Filled 2017-10-30: qty 1

## 2017-10-30 MED ORDER — PHENYLEPHRINE HCL 2.5 % OP SOLN
1.0000 [drp] | OPHTHALMIC | Status: AC
Start: 2017-10-30 — End: 2017-10-30
  Administered 2017-10-30 (×3): 1 [drp] via OPHTHALMIC

## 2017-10-30 MED ORDER — MOXIFLOXACIN HCL 0.5 % OP SOLN
1.0000 [drp] | OPHTHALMIC | Status: AC
Start: 2017-10-30 — End: 2017-10-30
  Administered 2017-10-30 (×3): 1 [drp] via OPHTHALMIC

## 2017-10-30 MED ORDER — TROPICAMIDE 1 % OP SOLN
1.0000 [drp] | OPHTHALMIC | Status: AC
Start: 2017-10-30 — End: 2017-10-30
  Administered 2017-10-30 (×3): 1 [drp] via OPHTHALMIC

## 2017-10-30 MED ORDER — ACETYLCHOLINE CHLORIDE 20 MG IO SOLR
INTRAOCULAR | Status: AC
Start: 2017-10-30 — End: 2017-10-30
  Administered 2017-10-30: 5 mg via INTRAOCULAR
  Filled 2017-10-30: qty 2

## 2017-10-30 MED ORDER — EPINEPHRINE HCL 1 MG/ML INJECTION
Freq: Once | INTRAMUSCULAR | Status: AC
Start: 2017-10-30 — End: 2017-10-30
  Administered 2017-10-30: 10:00:00 via OPHTHALMIC
  Filled 2017-10-30: qty 0.5

## 2017-10-30 MED ORDER — TETRACAINE HCL 0.5 % OP SOLN
OPHTHALMIC | Status: AC
Start: 2017-10-30 — End: 2017-10-30
  Administered 2017-10-30: 1 via OPHTHALMIC
  Filled 2017-10-30: qty 4

## 2017-10-30 MED ORDER — FLURBIPROFEN SODIUM 0.03 % OP SOLN
1.0000 [drp] | OPHTHALMIC | Status: AC
Start: 2017-10-30 — End: 2017-10-30
  Administered 2017-10-30 (×3): 1 [drp] via OPHTHALMIC

## 2017-10-30 MED ORDER — PREDNISOLONE ACETATE 1 % OP SUSP
1.0000 [drp] | Freq: Four times a day (QID) | OPHTHALMIC | Status: DC
Start: 2017-10-30 — End: 2017-10-30
  Administered 2017-10-30: 1 [drp] via OPHTHALMIC

## 2017-10-30 MED ORDER — REMIFENTANIL 20MCG/ML IV SYRINGE
INTRAMUSCULAR | Status: AC
Start: 2017-10-30 — End: 2017-10-30
  Filled 2017-10-30: qty 100

## 2017-10-30 MED ORDER — ERYTHROMYCIN 5 MG/GM OP OINT
0.50 [in_us] | TOPICAL_OINTMENT | Freq: Once | OPHTHALMIC | Status: AC
Start: 2017-10-30 — End: 2017-10-30
  Administered 2017-10-30: 0.5 [in_us] via OPHTHALMIC

## 2017-10-30 MED ORDER — ACETAMINOPHEN 325 MG PO TABS
650.0000 mg | ORAL_TABLET | Freq: Four times a day (QID) | ORAL | Status: DC | PRN
Start: 2017-10-30 — End: 2017-10-30

## 2017-10-30 MED ORDER — ACETAZOLAMIDE ER 500 MG PO CP12
500.0000 mg | ORAL_CAPSULE | Freq: Once | ORAL | Status: DC
Start: 2017-10-30 — End: 2017-10-30

## 2017-10-30 MED ORDER — BSS IO SOLN
INTRAOCULAR | Status: AC
Start: 2017-10-30 — End: 2017-10-30
  Administered 2017-10-30: 1
  Filled 2017-10-30: qty 15

## 2017-10-30 MED ORDER — GENTAMICIN SULFATE 40 MG/ML IJ SOLN
INTRAMUSCULAR | Status: AC
Start: 2017-10-30 — End: 2017-10-30
  Administered 2017-10-30: 20 mg
  Filled 2017-10-30: qty 2

## 2017-10-30 SURGICAL SUPPLY — 25 items
.9% NACL IRRIGATION 500ML (SOLUTION) ×2 IMPLANT
18G X 1.5IN SAFETY NDL (NEEDLE) ×2 IMPLANT
ALCON EYE PACK W/BSS SOLUTION (PACK) ×2 IMPLANT
ANGLED OPTH CRESCENT KNIFE (OPHSURG) ×2 IMPLANT
ANTERIOR CHAMBER CANNULA 27GA (OPHTH) ×2 IMPLANT
ANTERIOR CHAMBER CANNULA 30GA (OPHTH) ×2 IMPLANT
BASIC SINGLE BASIN TRAY (BASIN) ×2 IMPLANT
BI-POLAR CABLE (CAUTERY) ×2 IMPLANT
BLUNT FILTER NEEDLE 18X1.5 (NEEDLE) ×2 IMPLANT
CONTROL SYRINGE 10CC (SYRINGE) ×2 IMPLANT
DRESSING SPONGE 4X4 (DRESSING) ×2 IMPLANT
EDGEAHEAD KINFE 3MM 40 DEGREE (OPHSURG) ×2 IMPLANT
EYE PACK DRAPE FRONT PANEL (OPHTH) ×2 IMPLANT
EYE PADS (OPHTH) ×2 IMPLANT
EYEGARD SENSITIVE EYE GUARD (OPHTH) ×2 IMPLANT
I/A HANDPIECE 45D SILICONE (OPHINST) ×2 IMPLANT
I/O LENS LI61AO+18.5 ×2 IMPLANT
NYLON SUTURE 10-0 AU8 (SUTURE) ×2 IMPLANT
RETROBULBAR NEEDLE 25GAX38MM (NEEDLE) ×2 IMPLANT
SCLERATOME MULTI-SIDED BLADE (OPHSURG) ×2 IMPLANT
STERILE WATER IRR. 500ML (SOLUTION) ×2 IMPLANT
SURGEON GLOVE LF/PF 7 STER (GLOVE) ×2 IMPLANT
SURGICAL EYE SPEARS (ENT) ×2 IMPLANT
SURGICAL GOWN XXL (SURGPPE) ×2 IMPLANT
WET-FIELD ERASER 18GA 45 DEG (ADMIN) ×2 IMPLANT

## 2017-10-30 NOTE — Anesthesia Preprocedure Evaluation (Signed)
Pre-Anesthetic Note        Patient: Tammy Russell is a 62 year old female      Procedure Information     Date/Time:  10/30/17 0930    Procedure:  EXTRACTION, CATARACT, WITH IOL INSERTION (Right )    Diagnosis:  Cataract [H26.9]    Pre-op diagnosis:  Cataract [H26.9]    Location:  [facility] / [facility]    Surgeon:  Iver Nestle          Relevant Problems   No relevant active problems       Previous Anesthetic History:   Past Surgical History:  03/04/13: CATARACT REMOVAL INSERTION OF LENS; Left      Comment:  B&L LI61AO +18.5 MBarott  No date: CESAREAN SECTION, CLASSIC  04/28/13: POST-CATARACT LASER SURGERY; Left      Comment:  MBarott  No date: TONSILLECTOMY & ADENOIDECTOMY <AGE 48  2013: VARICOSE VEIN SURGERY      Comment:  in [place]  bilateral     Current Medications:      Current Facility-Administered Medications:  EPINEPHrine (ADRENALIN) 0.5 mg in balanced salts (BSS) 500 mL irrigation  Right Eye Once Madeline Barott   lidocaine 2%-EPINEPHrine 1:200,000 2 %-1:200000 injection       Acetylcholine Chloride (MIOCHOL-E) 20 MG intra-ocular injection       dexamethasone (DECADRON) 4 MG/ML injection       gentamicin 40 MG/ML injection       tetracaine (PONTOCAINE) 0.5 % ophthalmic solution       bupivacaine (MARCAINE) 0.75 % injection       balanced salts (BSS) ophthalmic solution           Home Medications    Medications Prior to Admission:  OTHER MEDICATION Takes [place]- Pro ( [place] HRT) Disp:  Rfl:     omeprazole (PRILOSEC) 10 MG capsule 1 capsule 2 (two) times daily. Disp:  Rfl:  10/30/2017 at Unknown time       Allergies:   Review of Patient's Allergies indicates:  No Known Allergies    Smoking, Alcohol, Drugs:  Social History    Tobacco Use      Smoking status: Never Smoker      Smokeless tobacco: Never Used    Alcohol use: No      Comment: rare      Drug use: No       PMHx:  Past Medical History:  No date: Bartholin gland cyst      Comment:  I & D in [place]   No date: Cataract  No date: Gestational diabetes  No date:  Reflux    Vitals  BP 127/69  Pulse 72  Temp 97 F (36.1 C) (Temporal)  Resp 18  Ht _0  (1.651 m)  Wt 68 kg (150 lb)  SpO2 99%  BMI 24.96 kg/m2      Physical Exam    General     Level of consciousness:  Oriented (time, person, place) and alert   Airway     Mallampati:  III    TM distance:  <3 FB    Mouth opening:  <3 FB    Neck ROM:  Mildly Limited   Teeth     Heart   - normal exam     Lungs - normal exam       EKG:  2014  Vent. Rate : 072 BPM   Atrial Rate : 072 BPM   P-R Int : 132 ms  QRS Dur : 082 ms   QT Int : 384 ms    P-R-T Axes : 062 072 075 degrees   QTc Int : 420 ms     Normal sinus rhythm   Normal ECG   No previous ECGs available     ROS/MED HX    Pertinent Labs:   Lab Results   Component Value Date    HCT 44.4 02/18/2013         Anesthesia Plan    ASA Score:     ASA:  2    Airway:      Mallampati:  III    Mouth opening:  <3 FB    Neck ROM:  Mildly Limited    TM distance:  <3 FB    Plan: MAC    Anesthesia Assessment and Plan:        States she gets agitated with GA.

## 2017-10-30 NOTE — H&P (Signed)
Today I have discussed symptoms and complaints with the patient, reviewed the patient's vital signs, and repeated a focused examination of the patient. There are not any changes since the complete H&P by Dr. Brennan Baileyhakroborty on 10/10/17, which I have reviewed today. The patient is medically stable to proceed with the planned surgery today.

## 2017-10-30 NOTE — Discharge Instructions (Signed)
DO NOT operate an automobile or any other heavy machinery for at least 12 hours.  Avoid making important and binding decisions for 24 hours after the surgery.  Dietery Instructions:  You may eat normally, but consider starting with liquids and easily digestible foods, and then progressing to your regular diet as tolerated by your stomach.    Medications:  1.  Unless directed differently by Dr. Chesley MiresBarott, DO NOT start your eye drops until after your first post-operative appointment.  You will be given a bag with drops.  2.  For mild eye discomfort, you may take Tylenol or Non-aspirin pain reliever as needed, every 4 hours for pain.  IF YOU HAVE MORE SEVERE PAIN, PLEASE CALL IMMEDIATELY TO [phone].  Dr. Chesley MiresBarott or Dr. Nathaneil CanaryPatalano will call you back for instructions.  3.  Continue to take all of your other regular medications that you are taking by mouth.  Also continue taking any eye drops which were previously prescribed for the other non-operative eye.    First post-operative appointment will [facility] the next day at [facility] [address] in [place], [place] [id number] at 1:30 PM.  Your eye dressing will be removed and your eye will be examined.  Your post-operative care, including medications, will be reviewed at that time.    Please be sure to keep your follow-up appointment with Dr. Chesley MiresBarott tomorrow.    NO opere um automvel ou qualquer outro maquinrio pesado por pelo menos 12 horas.  Evite tomar decises importantes e vinculativas durante 24 horas aps a cirurgia.  Instrues de dieta: Voc pode comer normalmente, mas considere comear com lquidos e alimentos facilmente digerveis e, em seguida, progredir para sua dieta regular, conforme tolerado pelo seu estmago.    Medicamentos:  1. A menos que seja orientado de forma diferente pelo Dr. Chesley MiresBarott, NO inicie o seu colrio at aps a sua primeira consulta ps-operatria. Voc ser dado um saco com gotas.  2. Para desconforto ocular leve, voc pode tomar  analgsico Tylenol ou No-aspirina conforme necessrio, a cada 4 horas para dor.  SE TIVER MAIS DOR GRAVE, POR FAVOR, LIGUE IMEDIATAMENTE PARA [phone]. O Dr. Chesley MiresBarott ou o Dr. Nathaneil CanaryPatalano telefonaro de volta para instrues.  3. Continue tomando todos os seus outros medicamentos regulares que voc est tomando por via oral. Continue tambm a tomar qualquer colrio previamente prescrito para o outro olho no operatrio.    A primeira consulta ps-operatria ser no dia seguinte no [facility] [address] [place], [place] [id number] s 13:30. O seu penso ser removido e o seu olho ser examinado. Seus cuidados ps-operatrios, incluindo medicamentos, sero revisados ??nesse momento.    Por favor, certifique-se de manter seu compromisso de acompanhamento com o Dr. Tilda BurrowBarott amanh.

## 2017-10-30 NOTE — Anesthesia Postprocedure Evaluation (Signed)
Anesthesia Post-Operative Evaluation Note    Patient: Tammy Russell           Procedure Summary     Date:  10/30/17 Room / Location:  [facility] 6 / [facility]    Anesthesia Start:  1000 Anesthesia Stop:  6945    Procedure:  EXTRACTION, CATARACT, WITH IOL INSERTION (Right Eye) Diagnosis:       Cataract      (Cataract [H26.9])    Surgeon:  Iver Nestle, MD Responsible Provider:  Candice Camp    Anesthesia Type:  MAC ASA Status:  2            POST-OPERATIVE EVALUATION    Anesthesia Post Evaluation    Vitals signs in patient's normal range: Yes  Respiratory function stable; airway patent: Yes  Cardiovascular function stable: Yes  Hydration status stable: Yes  Mental status recovered; patient participates in evaluation and/or is at baseline: Yes  Pain control satisfactory: Yes  Nausea and vomiting control satisfactory: Yes    Procedure was labor & delivery no  PostOP disposition PACU  Anesthesia Observation no significant observation      Last vitals  BP   112/70 (10/30/17 1041)    Temp   97.2 F (36.2 C) (10/30/17 1041)    Pulse   82 (10/30/17 1041)   Resp   13 (10/30/17 1041)    SpO2   100 % (10/30/17 1041)

## 2017-10-30 NOTE — Op Note (Signed)
DATE OF THE PROCEDURE:  10/30/2017    PROCEDURE:  Phacoemulsification cataract extraction with posterior chamber intraocular lens implantation, right eye    PRE-OP DIAGNOSIS:  Cataract, right eye    POST-OP DIAGNOSIS:  The same    SURGEON:  Marylou MccoyMadeline Tersa Fotopoulos, MD    ASSISTANT:  None    ANESTHESIA:  Local infiltration with IV sedation.    The patient was prepped and draped in the usual manner for ocular surgery of the right eye.  Anesthesia and akinesia was achieved using a mixture of 2% Xylocaine with epinephrine and 0.5% Marcaine in a 50/50 mixture.  This was administered in a peribulbar block.  Steri-Strips were placed over the lashes of the right eye and a lid speculum was placed in the right eye.  A limbal peritomy was performed superiorly.  Bleeding was controlled with wet-field cautery.  A groove was made at the surgical limbus using a 57 Beaver blade.  The wound was extended anteriorly into clear cornea using a spoon blade.  The anterior chamber was entered at 2 o'clock using a Super blade.  Viscoelastic was injected into the anterior chamber.  The anterior chamber was then entered through the tunneled incision using a 3.0-mm keratome.  An anterior capsulotomy was performed using a continuous curvilinear approach.  The anterior capsular flap was removed from the eye.  BSS was used to hydrodissect and hydrodelineate the lens nucleus.  The lens nucleus was then emulsified and aspirated using the phacoemulsification unit in a divide-and-conquer technique.  The remainder of the cortical material was aspirated using the irrigating aspirating apparatus.  Viscoelastic was then again injected to inflate the posterior capsular bag.  Under viscoelastic a posterior chamber intraocular lens, Bausch and Lomb model LI61AO of +18.5 diopters was injected into the posterior capsular bag.  The remainder of the viscoelastic was aspirated from the eye.  Miochol was injected into the eye.  The wound was then closed using a single  10-0 nylon interrupted suture.  The conjunctiva was brought down over the wound.  A 0.5 mL of gentamicin and 0.5 mL of Decadron were injected subseptally.  A drop of Vigamox, Pred forte and a drop of erythromycin eye ointment were placed in the right eye and a patch and Fox shield were applied.  The patient left the operating room in excellent condition for the recovery room.

## 2017-10-31 ENCOUNTER — Ambulatory Visit: Payer: PPO | Admitting: Ophthalmology

## 2017-10-31 DIAGNOSIS — Z961 Presence of intraocular lens: Secondary | ICD-10-CM

## 2017-10-31 MED ORDER — MOXIFLOXACIN HCL 0.5 % OP SOLN: 1 [drp] | mL | Freq: Four times a day (QID) | OPHTHALMIC | 0 refills | 0 days | Status: AC

## 2017-10-31 MED ORDER — MOXIFLOXACIN HCL 0.5 % OP SOLN
1.00 [drp] | Freq: Four times a day (QID) | OPHTHALMIC | 0 refills | Status: AC
Start: 2017-10-31 — End: 2017-11-07

## 2017-10-31 MED ORDER — PREDNISOLONE ACETATE 1 % OP SUSP
1.0000 [drp] | Freq: Four times a day (QID) | OPHTHALMIC | 2 refills | Status: DC
Start: 2017-10-31 — End: 2017-11-07

## 2017-10-31 MED ORDER — PREDNISOLONE ACETATE 1 % OP SUSP: 1 [drp] | mL | Freq: Four times a day (QID) | OPHTHALMIC | 2 refills | 0 days | Status: DC

## 2017-10-31 NOTE — Progress Notes (Signed)
62 years / F  -  1 day s/p phaco w/ PC IOL - OD 10/30/2017 -Dr. Chesley MiresBarott.    Pt reports FB sensation "sand" - OD since surgery yesterday.    Pt states both eyes are itchy d/t "allergies"      H/O: pseudophakia OS 2014 Dr. Chesley MiresBarott

## 2017-10-31 NOTE — Progress Notes (Signed)
Impression:  Pseudophakia OU:  Doing well 1 day s/p cataract surgery OD    Plan:  Begin Pred forte and Vigamox OD qid  Shield at bedtime X 1 week  Call with increased pain or decreased vision  See 1 week.

## 2017-11-07 ENCOUNTER — Ambulatory Visit: Payer: PPO | Admitting: Ophthalmology

## 2017-11-07 DIAGNOSIS — Z961 Presence of intraocular lens: Secondary | ICD-10-CM

## 2017-11-07 MED ORDER — PREDNISOLONE ACETATE 1 % OP SUSP: 1 [drp] | mL | Freq: Two times a day (BID) | OPHTHALMIC | 0 refills | 0 days | Status: DC

## 2017-11-07 MED ORDER — PREDNISOLONE ACETATE 1 % OP SUSP
1.0000 [drp] | Freq: Two times a day (BID) | OPHTHALMIC | 0 refills | Status: DC
Start: 2017-11-07 — End: 2017-12-21

## 2017-11-07 NOTE — Progress Notes (Signed)
1 week s/p Phaco CE+PC IOL OD (10/30/17). Vision is good.  H/o Pseudophakia OS ('14).

## 2017-11-07 NOTE — Progress Notes (Signed)
Impression:.  Pseudophakia OU:  Doing well 1 week s/p cataract surgery OD    Plan:  Discontinue vigamox  Decrease Pred forte to tid X 2 weeks, then bid if comfortable.  See 1 month for final refraction and dilation.

## 2017-12-19 ENCOUNTER — Ambulatory Visit: Payer: PPO | Admitting: Ophthalmology

## 2017-12-19 DIAGNOSIS — Z961 Presence of intraocular lens: Secondary | ICD-10-CM

## 2017-12-19 NOTE — Progress Notes (Signed)
Here for 1 month F/U of S/P PCIOL OD on 10/30/2017 by Dr Windy CarinaBarott,H/O Pseudophakia OS(03/04/2013),Presbyopia.   Vision is ok.   No pain.   No flashes/floaters.    Ocular meds    None

## 2017-12-21 NOTE — Progress Notes (Signed)
Impression:.  Pseudophakia OU:  Doing well 1 month s/p cataract surgery OD    Plan:  Discontinue  Pred forte  Rx given for spectacle lenses  See 6-12 months.

## 2019-02-21 LAB — HEMOGLOBIN A1C CARE EVERYWHERE
HEMOGLOBIN A1C CARE EVERYWHERE: 6.3 % — ABNORMAL HIGH (ref 0.0–5.6)
MEAN BLOOD GLUCOSE CARE EVERYWHERE: 124 mg/dL — NL

## 2019-02-21 LAB — HIV ANTIGEN ANTIBODY CARE EVERYWHERE: HIV 1/2 PLUS O AG/AB SCREEN CARE EVERYWHERE: NONREACTIVE — NL

## 2019-06-11 ENCOUNTER — Ambulatory Visit: Payer: BC Managed Care – PPO | Attending: Ophthalmology | Admitting: Ophthalmology

## 2019-06-11 ENCOUNTER — Other Ambulatory Visit: Payer: Self-pay

## 2019-06-11 DIAGNOSIS — Z961 Presence of intraocular lens: Secondary | ICD-10-CM | POA: Diagnosis not present

## 2019-06-11 DIAGNOSIS — H04123 Dry eye syndrome of bilateral lacrimal glands: Secondary | ICD-10-CM | POA: Diagnosis not present

## 2019-06-11 MED ORDER — POLYVINYL ALCOHOL 1.4 % OP SOLN
1.00 [drp] | OPHTHALMIC | 11 refills | Status: AC | PRN
Start: 2019-06-11 — End: 2020-06-10

## 2019-06-11 NOTE — Progress Notes (Signed)
Tammy Russell is a 64 year old female.     Here for comprehensive eye exam.    Pt reports dry, itchy eyes.  No gtt's.    No other ocular c/o's.  No VA problem SC.  Pt wears glasses for reading only.    No pain, No headache.  No flashes or floaters.

## 2019-06-11 NOTE — Progress Notes (Signed)
Impression:  Dry eye syndrome  Pseudophakia OU    Plan:  Tears OU prn irritation  Rx given for spectacle lenses  Observe  See annually

## 2019-06-18 LAB — HEMOGLOBIN A1C CARE EVERYWHERE
HEMOGLOBIN A1C CARE EVERYWHERE: 6.3 % — ABNORMAL HIGH (ref 0.0–5.6)
MEAN BLOOD GLUCOSE CARE EVERYWHERE: 124 mg/dL — NL

## 2019-06-30 ENCOUNTER — Other Ambulatory Visit: Payer: Self-pay

## 2019-07-03 ENCOUNTER — Other Ambulatory Visit: Payer: Self-pay

## 2019-07-03 ENCOUNTER — Ambulatory Visit: Payer: BC Managed Care – PPO | Attending: Internal Medicine

## 2019-07-03 DIAGNOSIS — Z23 Encounter for immunization: Secondary | ICD-10-CM | POA: Diagnosis present

## 2019-07-31 ENCOUNTER — Ambulatory Visit: Payer: BC Managed Care – PPO | Attending: Internal Medicine

## 2019-07-31 ENCOUNTER — Other Ambulatory Visit: Payer: Self-pay

## 2019-07-31 DIAGNOSIS — Z23 Encounter for immunization: Secondary | ICD-10-CM | POA: Diagnosis present

## 2020-04-09 LAB — COVID-19 CARE EVERYWHERE: COVID-19 CARE EVERYWHERE: POSITIVE — AB

## 2020-05-19 LAB — HEMOGLOBIN A1C CARE EVERYWHERE
HEMOGLOBIN A1C CARE EVERYWHERE: 6.1 % — ABNORMAL HIGH (ref 0.0–5.6)
MEAN BLOOD GLUCOSE CARE EVERYWHERE: 117 mg/dL — NL

## 2020-06-16 ENCOUNTER — Ambulatory Visit: Payer: BC Managed Care – PPO | Attending: Ophthalmology | Admitting: Ophthalmology

## 2020-06-16 ENCOUNTER — Other Ambulatory Visit: Payer: Self-pay

## 2020-06-16 DIAGNOSIS — Z961 Presence of intraocular lens: Secondary | ICD-10-CM | POA: Diagnosis not present

## 2020-06-16 DIAGNOSIS — H04123 Dry eye syndrome of bilateral lacrimal glands: Secondary | ICD-10-CM | POA: Diagnosis not present

## 2020-06-16 MED ORDER — SYSTANE PRESERVATIVE FREE 0.4-0.3 % OP SOLN
1.00 [drp] | Freq: Four times a day (QID) | OPHTHALMIC | 11 refills | Status: AC
Start: 2020-06-16 — End: 2021-06-16

## 2020-06-16 NOTE — Progress Notes (Signed)
Impression:  Dry eye syndrome  Pseudophakia OU    Plan:  Tears OU prn irritation/ ointment at nighttime if helpful  Rx given for spectacle lenses  Observe  See annually

## 2020-06-16 NOTE — Progress Notes (Signed)
Annual eye exam, f/u Dry eyes, Pseudophakia ou (remote), s/p YAG OS ('15).    Vision is blurry in am.    Itchy eyes. Does not like eye drops.

## 2021-12-07 LAB — HEMOGLOBIN A1C CARE EVERYWHERE
ESTIMATED AVERAGE GLUCOSE CARE EVERYWHERE: 117 mg/dL
HEMOGLOBIN A1C CARE EVERYWHERE: 6.1 % — ABNORMAL HIGH (ref 0.0–5.6)

## 2022-05-08 ENCOUNTER — Ambulatory Visit (HOSPITAL_BASED_OUTPATIENT_CLINIC_OR_DEPARTMENT_OTHER): Payer: BC Managed Care – PPO | Admitting: Ophthalmology

## 2022-05-10 ENCOUNTER — Encounter (HOSPITAL_BASED_OUTPATIENT_CLINIC_OR_DEPARTMENT_OTHER): Payer: Self-pay | Admitting: Ophthalmology

## 2022-05-10 ENCOUNTER — Ambulatory Visit: Payer: BC Managed Care – PPO | Attending: Ophthalmology | Admitting: Ophthalmology

## 2022-05-10 ENCOUNTER — Other Ambulatory Visit: Payer: Self-pay

## 2022-05-10 DIAGNOSIS — Z961 Presence of intraocular lens: Secondary | ICD-10-CM | POA: Diagnosis not present

## 2022-05-10 DIAGNOSIS — H04123 Dry eye syndrome of bilateral lacrimal glands: Secondary | ICD-10-CM | POA: Diagnosis not present

## 2022-05-10 NOTE — Progress Notes (Signed)
Pt here for comprehensive eye exam    Pt denies any problems or changes with va but she does c/o excess tearing at times  Denies flashes or floaters ou  Pt doesn't use eye gtts, she doesn't like how they feel    Ocular HX-Dry eye syndrome  Pseudophakia    LEE-06-16-20

## 2022-05-11 MED ORDER — CARBOXYMETHYLCELLULOSE SODIUM 0.5 % OP SOLN
1.0000 [drp] | Freq: Four times a day (QID) | OPHTHALMIC | 11 refills | Status: AC | PRN
Start: 2022-05-11 — End: 2023-05-12

## 2022-05-11 NOTE — Progress Notes (Signed)
Impression:  Dry eye syndrome  Pseudophakia OU    Plan:  Tears OU prn irritation  Rx given for spectacle lenses  Observe  See annually

## 2022-06-09 IMAGING — MR JOELHO ESQ
5 of 6 series · 18 of 40 positions shown · non-contrast
Comparison: none

[Series 1: 3-plane esq · axial · 5.0mm · 0.55mm/px · z∈[-10,+140]mm · 2 of 15 slices shown]
[im 1/15]
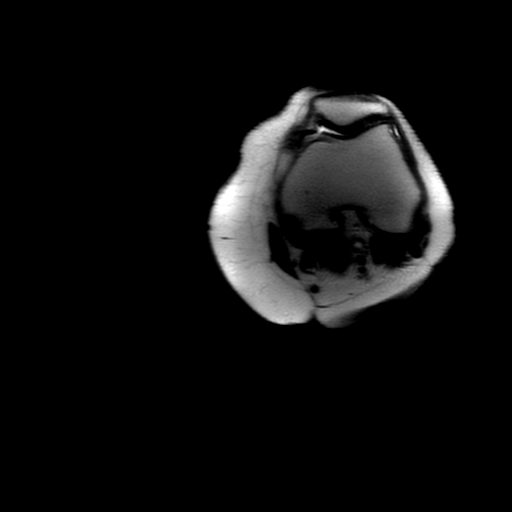
[im 15/15]
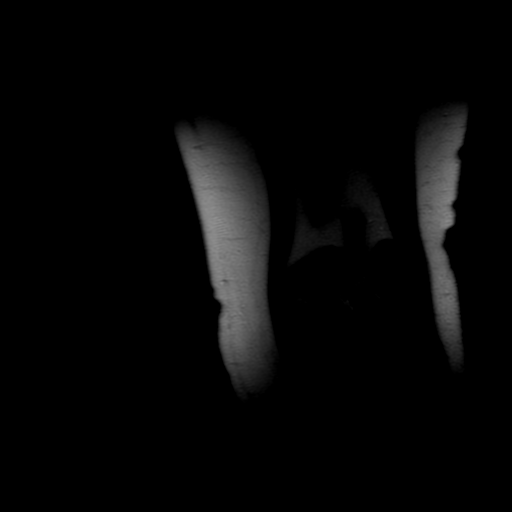

[Series 3: T1 · sagittal · 4.0mm · 0.31mm/px · 4 of 20 slices shown]
[im 1/20]
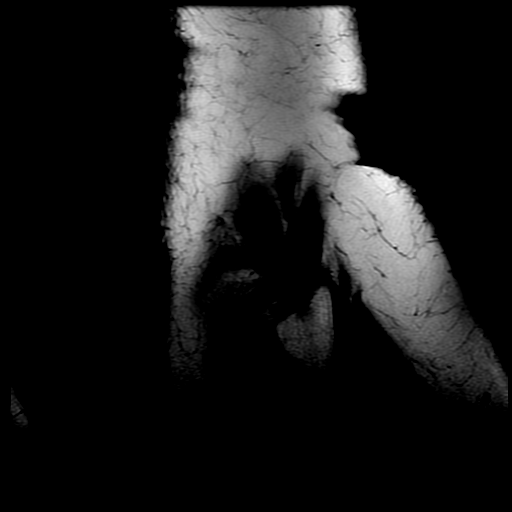
[im 7/20]
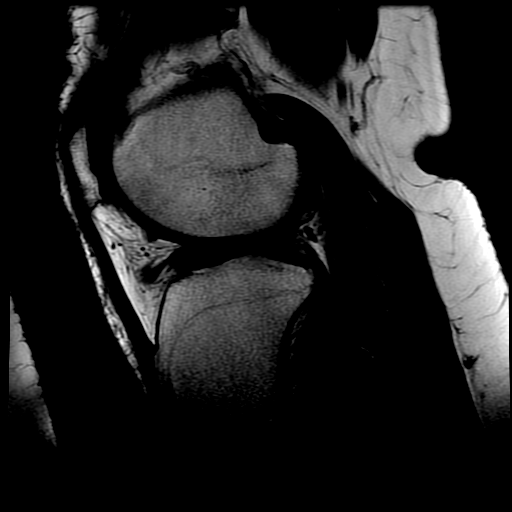
[im 13/20]
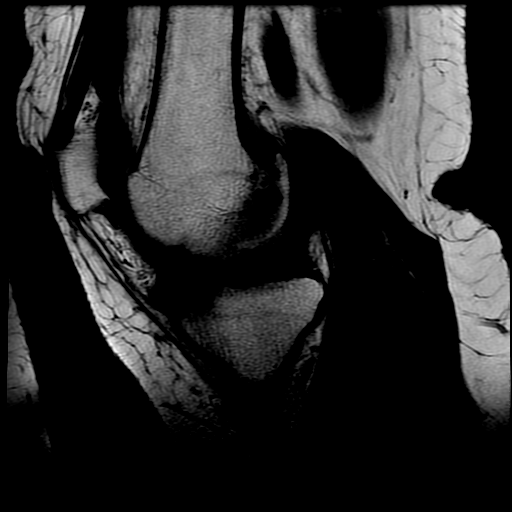
[im 20/20]
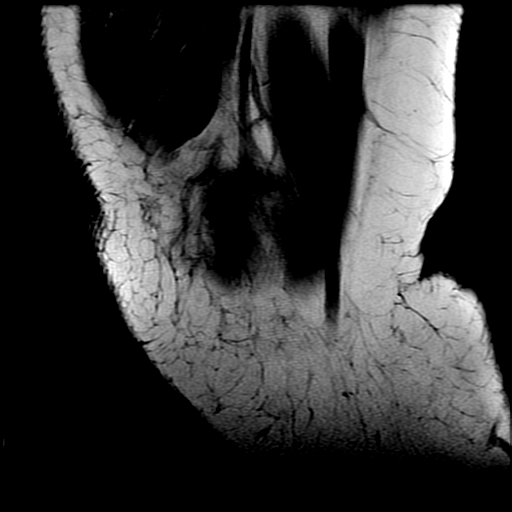

[Series 4: PD · sagittal · 4.0mm · 0.31mm/px · 4 of 20 slices shown (1 of 3)]
[im 1/20]
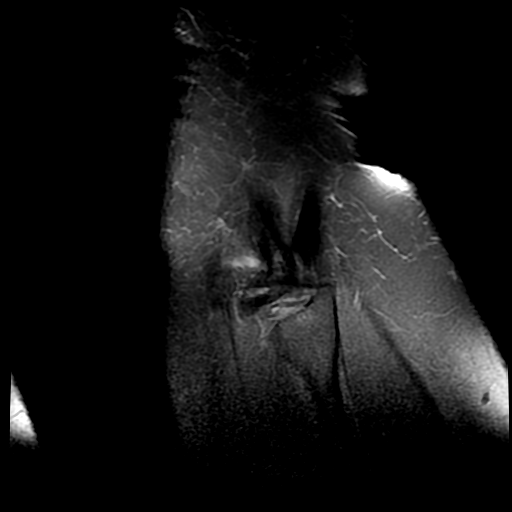
[im 7/20]
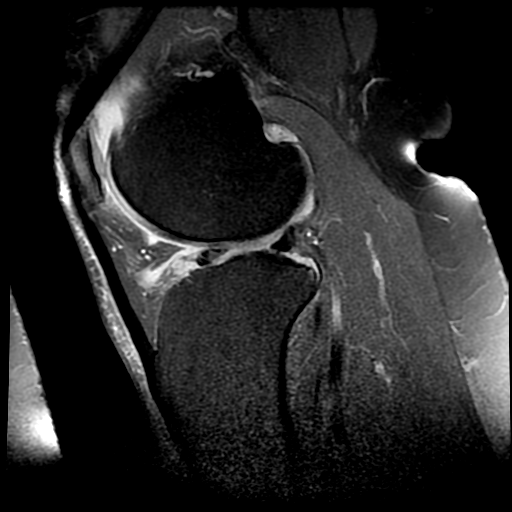
[im 13/20]
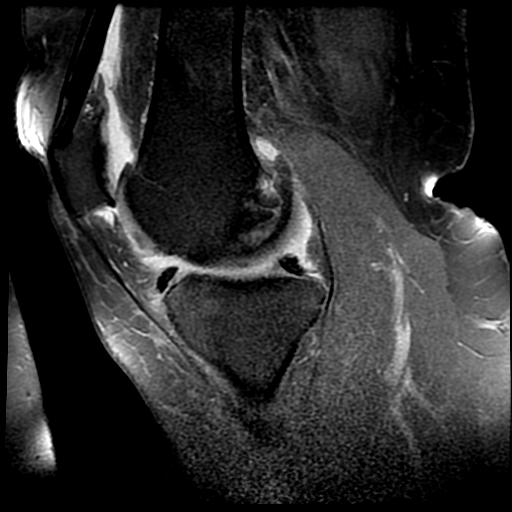
[im 20/20]
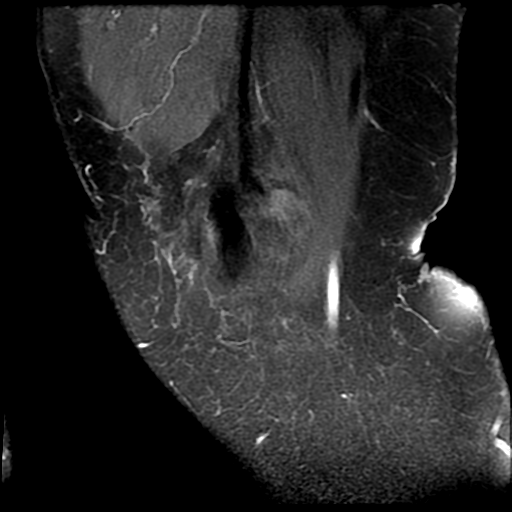

[Series 5: PD · coronal · 3.0mm · 0.31mm/px · 4 of 24 slices shown (2 of 3)]
[im 1/24]
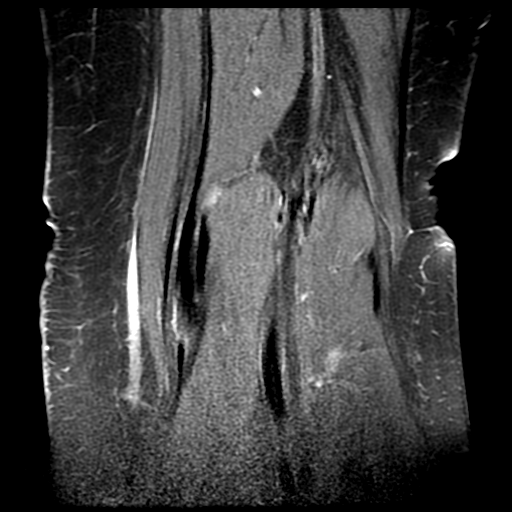
[im 8/24]
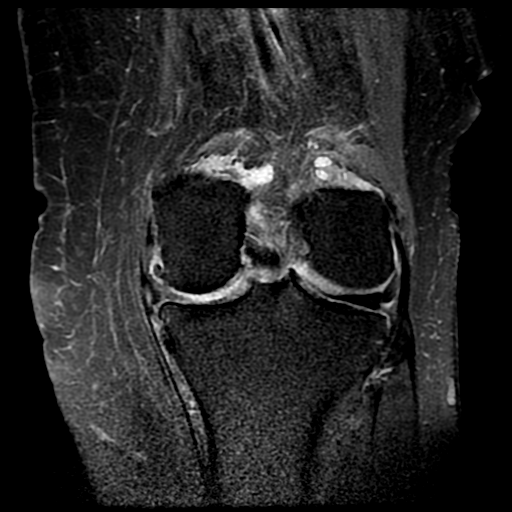
[im 16/24]
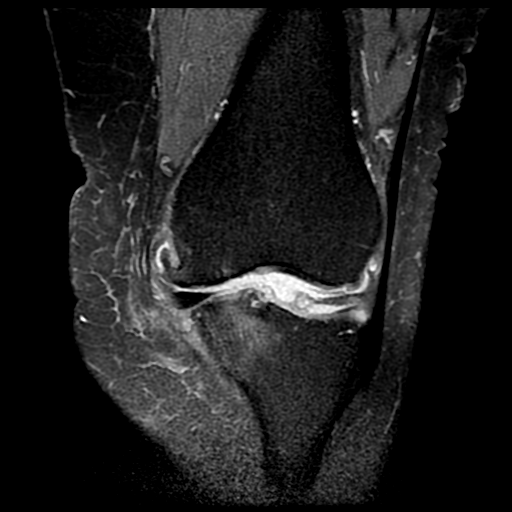
[im 24/24]
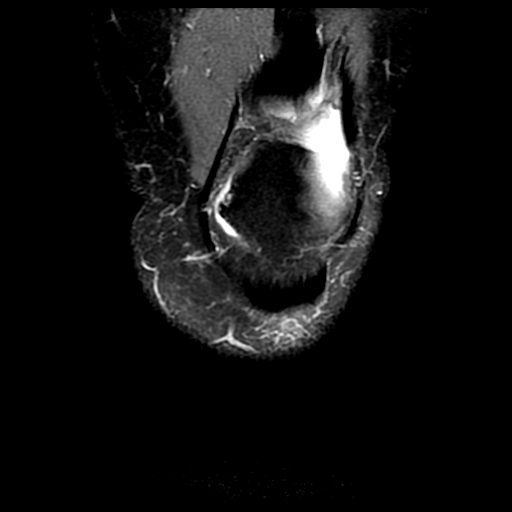

[Series 6: PD · axial · 4.0mm · 0.33mm/px · z∈[-81,+2]mm · 4 of 20 slices shown (3 of 3)]
[im 1/20]
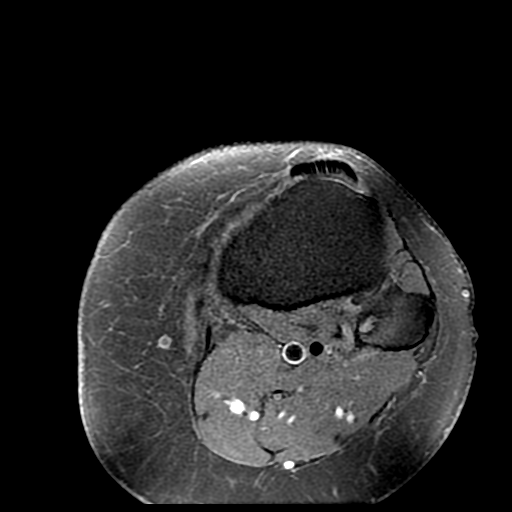
[im 7/20]
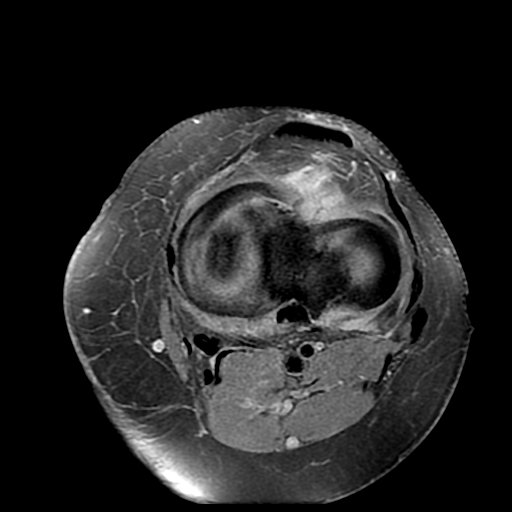
[im 13/20]
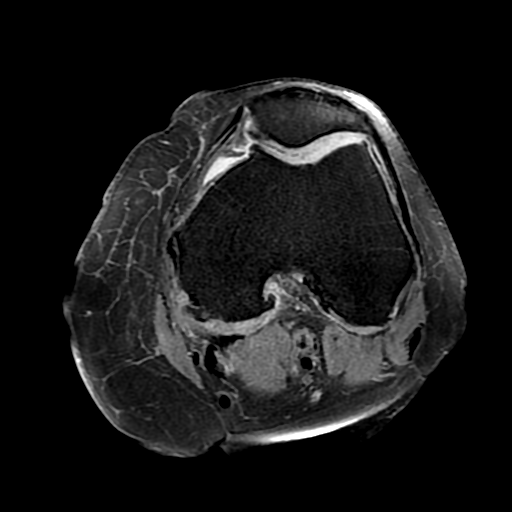
[im 20/20]
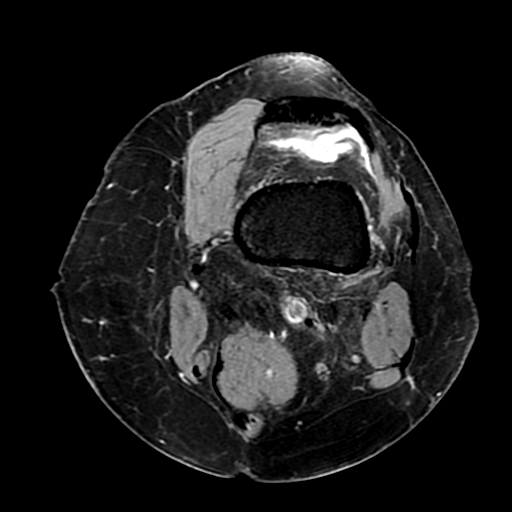

[18 of 40 positions shown; findings below may reference images not displayed]

RESSONÂNCIA MAGNÉTICA DO JOELHO ESQUERDO
Método:
Ressonância magnética realizada com sequências FSE em T1 e T2. Planos de cortes múltiplos. 
Análise:
Alteração degenerativa difusa do menisco medial com amputação irregularidade da margem livre do corpo e
corno posterior, sem fragmentos meniscais destacados. Observa-se leve extrusão do corpo meniscal em
relação a interlinha articular.
Alteração degenerativa difusa do menisco lateral com discreta irregularidade da margem livre e maior
degeneração próximo da raiz anterior, com leve perimeniscite, sem fragmentos meniscais destacados.
Alterações degenerativas dos compartimentos femorotibiais caracterizadas por reações osteofitárias
marginais, com afilamento e irregularidade difusa dos revestimentos condrais, predominando nas áreas de
carga e, notadamente, no compartimento medial, no qual se observam áreas de exposição da placa óssea
subcondral, com edema e cistos subcondrais adjacentes, bem como remodelamento das superfícies
articulares apostas.
Alterações degenerativas incipientes do compartimento patelofemoral caracterizadas por reações osteofitárias
marginais, com afilamento e irregularidade dos revestimentos condrais, predominando nos terços médio e
inferior da patela e nos terços superior e médio da tróclea femoral, com algumas fissuras profundas
adjacentes, sem edema ósseo subcondral definido.
Discreta entesopatia insercional do quadríceps, sem roturas. Ligamento patelar íntegro.
Ligamentos cruzados e colaterais íntegros.
Hipertrofia das espinhas tibiais, com pequenos cistos subcorticais adjacentes.
Remodelamento degenerativo da interface tibiofibular proximal com edema e cistos subcondrais adjacentes,
notadamente na fíbula.
Demais estruturas ósseas com morfologia e sinal normais.
Pequeno derrame articular com sinais de sinovite, provavelmente reacional.
Demais planos musculares e tendíneos preservados.
Feixes neurovasculares com trajetos livres.
Leve edema da tela subcutânea do joelho, sem coleções.
IMPRESSÃO DIAGNÓSTICA:
Alteração degenerativa difusa dos meniscos, pormenorizada acima, com extrusão do corpo meniscal em
relação à interlinha articular e sinais de perimeniscite.
Alteração degenerativa dos compartimentos femorotibiais, predominando no compartimento medial.
Alteração degenerativa incipiente patelofemoral.
Pequeno derrame articular com sinais de sinovite, provavelmente reacional.
Discreta entesopatia insercional do quadríceps, sem roturas. 
Alteração degenerativa tibiofibular proximal.
Demais achados acima descritos.

## 2022-06-09 IMAGING — MR JOELHO DIR
6 of 7 series · 18 of 40 positions shown · non-contrast
Comparison: none

[Series 1: 3-plane dir · axial · 5.0mm · 0.55mm/px · z∈[-20,+140]mm · 4 of 19 slices shown]
[im 1/19]
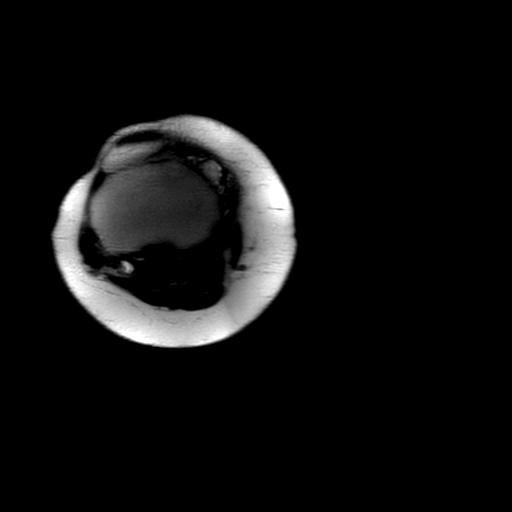
[im 7/19]
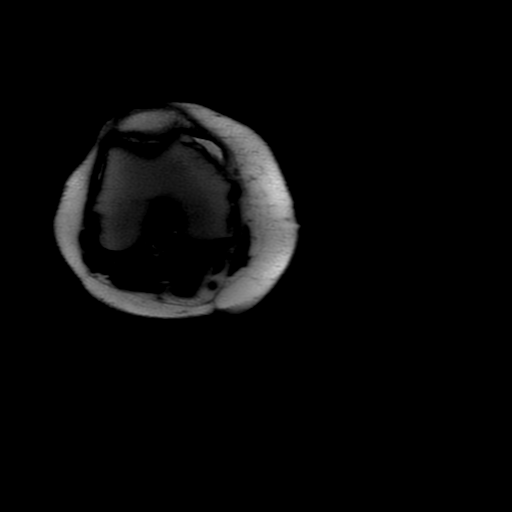
[im 13/19]
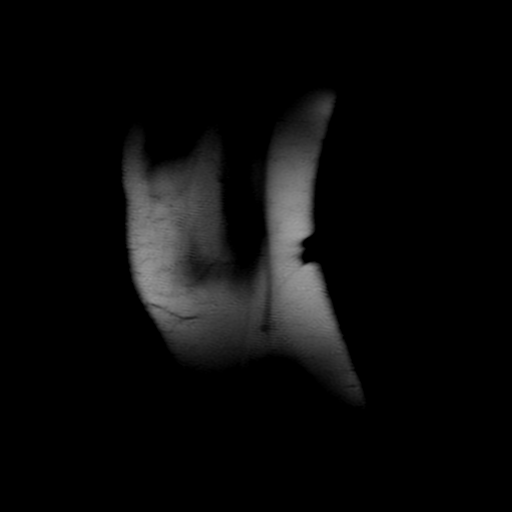
[im 19/19]
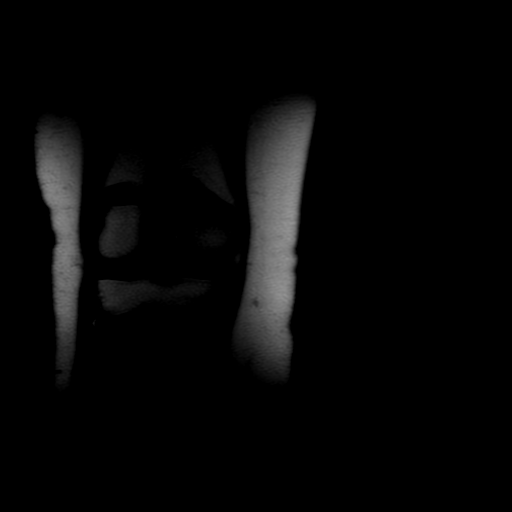

[Series 3: T1 · sagittal · 4.0mm · 0.31mm/px · 3 of 20 slices shown]
[im 1/20]
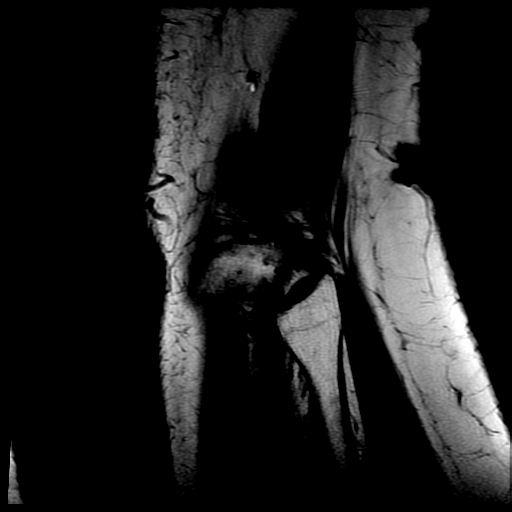
[im 10/20]
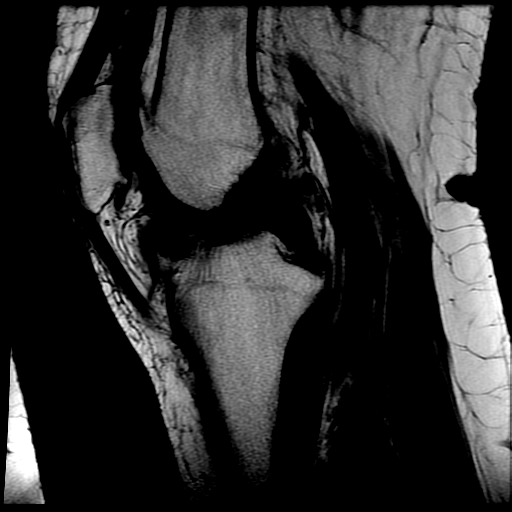
[im 20/20]
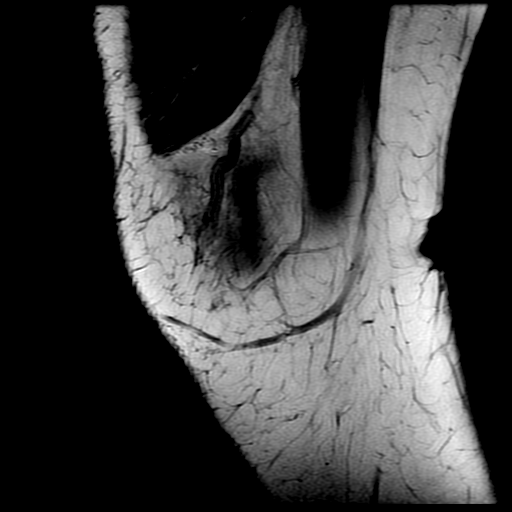

[Series 4: PD · sagittal · 4.0mm · 0.31mm/px · 3 of 20 slices shown (1 of 3)]
[im 1/20]
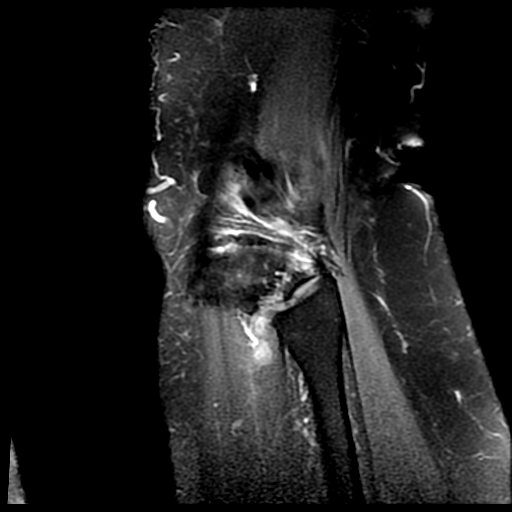
[im 10/20]
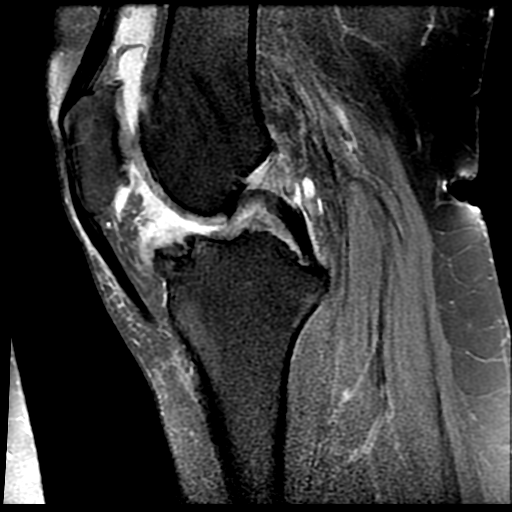
[im 20/20]
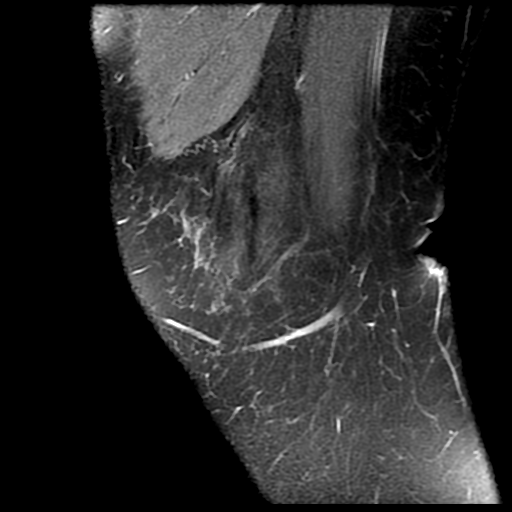

[Series 5: PD · coronal · 3.0mm · 0.31mm/px · 4 of 24 slices shown (2 of 3)]
[im 1/24]
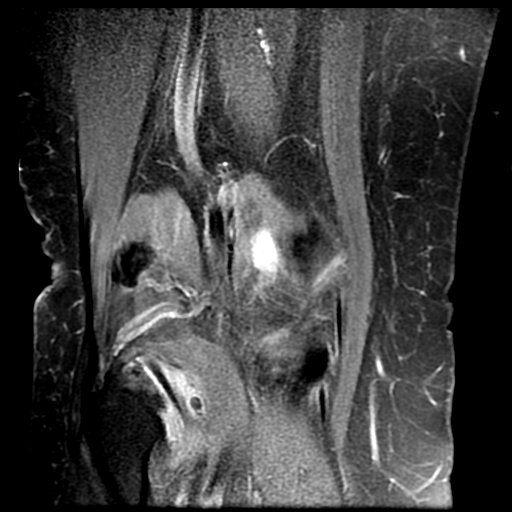
[im 8/24]
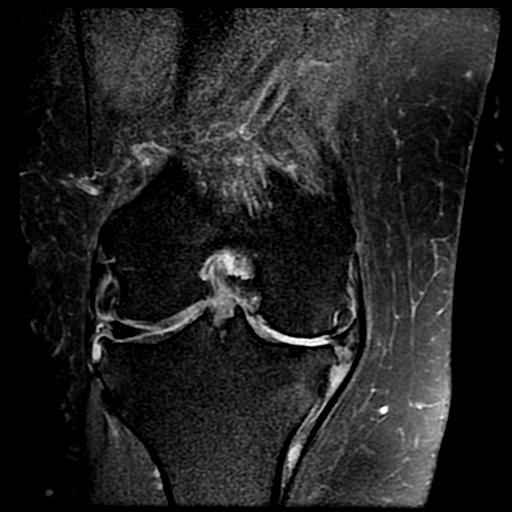
[im 16/24]
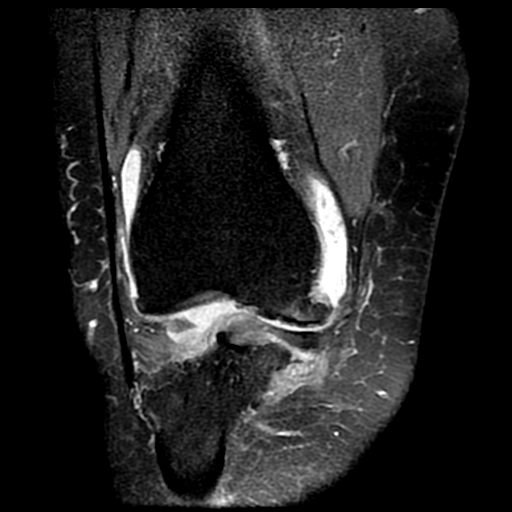
[im 24/24]
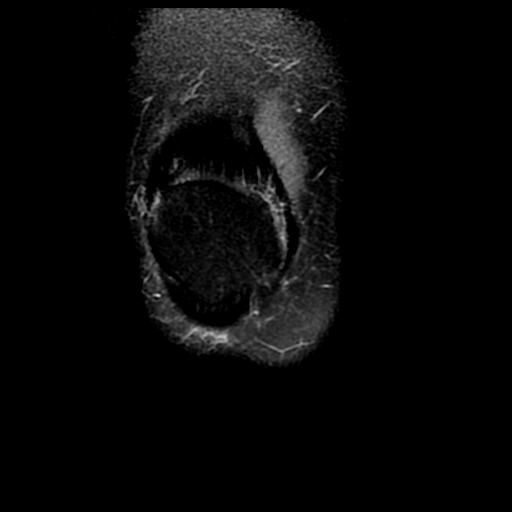

[Series 6: PD · axial · 4.0mm · 0.31mm/px · z∈[-37,+47]mm · 3 of 20 slices shown (3 of 3)]
[im 1/20]
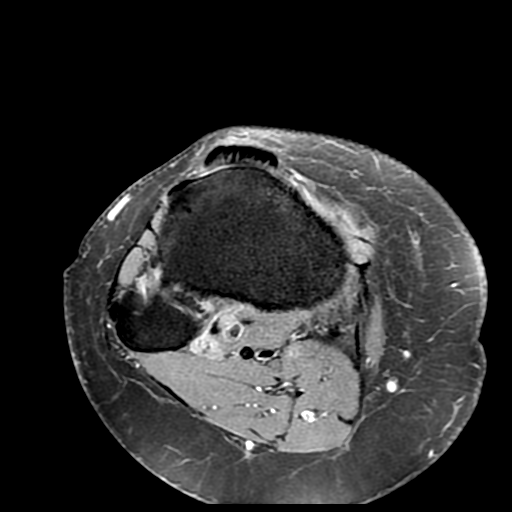
[im 10/20]
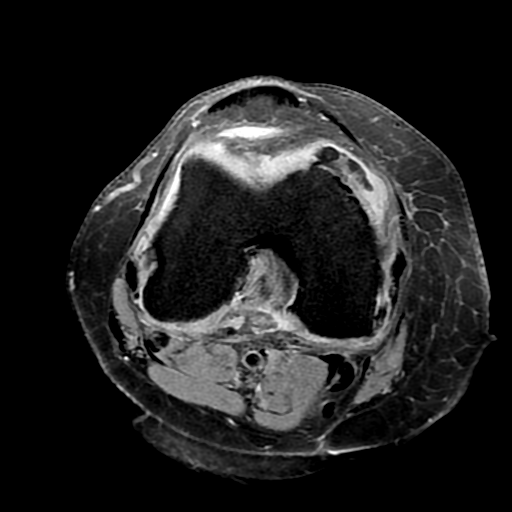
[im 20/20]
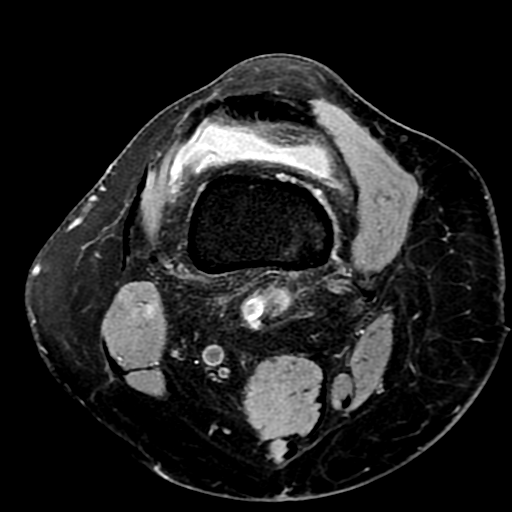

[Series 7: T2 · sagittal · 2.0mm · 0.23mm/px · 1 of 9 slices shown]
[im 1/9]
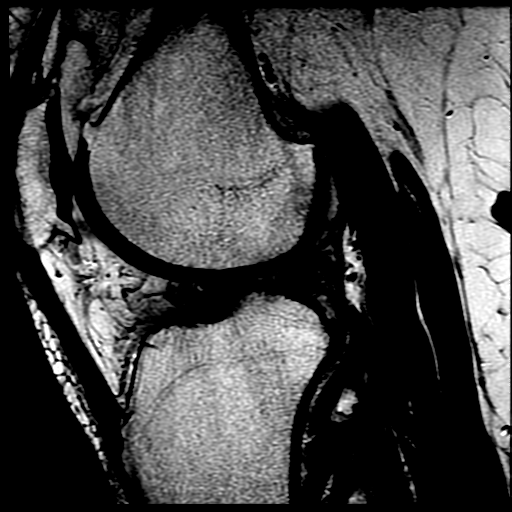

[18 of 40 positions shown; findings below may reference images not displayed]

RESSONÂNCIA MAGNÉTICA DO JOELHO DIREITO
Método:
Ressonância magnética realizada com sequências FSE em T1 e T2. Planos de cortes múltiplos. 
Análise:
Alteração degenerativa difusa do menisco medial, com importante redução volumétrica dos seus cornos
anterior e posterior, com maceração dos seus remanescentes, destacando-se rotura radial próximo da raiz
posterior, com extrusão do corpo meniscal em relação à interlinha articular e sinais de perimeniscite. Cistos
gangliônicos/parameniscais adjacentes ao corpo do menisco medial, estendendo-se por até 3,4 cm no seu
maior eixo craniocaudal.
 Alteração degenerativa difusa do menisco medial, predominando no corpo e corno anterior, com rotura
horizontal associada, bem como amputação e irregularidade da margem livre, com sinais de perimeniscite
difusa. 
Alterações degenerativas dos compartimentos femorotibiais caracterizadas por reações osteofitárias
marginais, com afilamento e irregularidade difusa dos revestimentos condrais, predominando nas áreas de
carga e, notadamente, no compartimento medial, no qual se observam áreas de exposição da placa óssea
subcondral, com esclerose, edema e cistos subcondrais adjacentes, bem como remodelamento das superfícies
articulares apostas.
Alterações degenerativas do compartimento patelofemoral caracterizadas por reações osteofitárias marginais,
com afilamento e irregularidade dos revestimentos condrais, predominando nos terços médio e inferior da
patela e nos terços superior e médio da tróclea femoral, com algumas fissuras profundas adjacentes, sem
edema ósseo subcondral definido.
Discreta entesopatia insercional do quadríceps, sem roturas. Ligamento patelar íntegro.
Alteração degenerativa mucinosa do ligamento cruzado anterior, sem transfixações atuais.
Ligamentos cruzado posterior e colaterais íntegros.
Hipertrofia das espinhas tibiais, com pequenos cistos subcorticais adjacentes.
Demais estruturas ósseas com morfologia e sinal normais.
Moderado derrame articular com sinais de sinovite, provavelmente reacional.
Corpo livre ossificado no recesso posterior e medindo cerca de 0,6 cm.
Demais planos musculares e tendíneos preservados.
Feixes neurovasculares com trajetos livres.
Leve edema da tela subcutânea do joelho, sem coleções.
IMPRESSÃO DIAGNÓSTICA:
Alteração degenerativa difusa dos meniscos, notadamente no medial, pormenorizada acima, com extrusão do
corpo meniscal em relação à interlinha articular e sinais de perimeniscite.
Alteração degenerativa dos compartimentos femorotibiais, predominando no compartimento medial.
Alteração degenerativa patelofemoral.
Moderado derrame articular com sinais de sinovite, provavelmente reacional.
Corpo livre ossificado no recesso posterior e medindo cerca de 0,6 cm.
Discreta entesopatia insercional do quadríceps, sem roturas. 
Alteração degenerativa mucinosa do ligamento cruzado anterior, sem transfixações atuais.
Demais achados acima descritos.

## 2022-07-06 LAB — INFLUENZA A AND B POC CARE EVERYWHERE
INFLUENZA A POC CARE EVERYWHERE: POSITIVE — AB
INFLUENZA B POC CARE EVERYWHERE: NEGATIVE

## 2023-02-14 LAB — HEMOGLOBIN A1C CARE EVERYWHERE
ESTIMATED AVERAGE GLUCOSE CARE EVERYWHERE: 110 mg/dL
HEMOGLOBIN A1C CARE EVERYWHERE: 5.9 % — ABNORMAL HIGH (ref 0.0–5.6)

## 2023-05-16 ENCOUNTER — Other Ambulatory Visit: Payer: Self-pay

## 2023-05-16 ENCOUNTER — Ambulatory Visit: Attending: Ophthalmology | Admitting: Ophthalmology

## 2023-05-16 DIAGNOSIS — Z961 Presence of intraocular lens: Secondary | ICD-10-CM | POA: Insufficient documentation

## 2023-05-16 DIAGNOSIS — H04123 Dry eye syndrome of bilateral lacrimal glands: Secondary | ICD-10-CM | POA: Insufficient documentation

## 2023-05-16 MED ORDER — CARBOXYMETHYLCELLULOSE SODIUM 0.5 % OP SOLN
1.0000 [drp] | Freq: Four times a day (QID) | OPHTHALMIC | 11 refills | Status: AC | PRN
Start: 2023-05-16 — End: 2024-05-16

## 2023-05-16 NOTE — Progress Notes (Signed)
Pt here for yearly exam.     Sometimes eyes are itchy. Dry eyes not using Artifical tears.    LEE 05/10/22    Ocular HX:  Dry eye syndrome  Pseudophakia OU

## 2023-05-16 NOTE — Progress Notes (Signed)
Impression:  Dry eye syndrome  Prediabetes without retinopathy  Pseudophakia OU    Plan:  Tears OU prn irritation  Sample of Blink oral supplements given  Rx given for spectacle lenses  Observe  See annually

## 2023-07-05 ENCOUNTER — Other Ambulatory Visit: Payer: Self-pay | Admitting: Allergy

## 2024-05-21 ENCOUNTER — Ambulatory Visit: Admitting: Ophthalmology
